# Patient Record
Sex: Female | Born: 1960 | Race: White | Hispanic: No | Smoking: Never smoker
Health system: Southern US, Community
[De-identification: ages and names within clinical notes are randomized; demographics above are authoritative.]

## PROBLEM LIST (undated history)

## (undated) DIAGNOSIS — D649 Anemia, unspecified: Secondary | ICD-10-CM

## (undated) DIAGNOSIS — N879 Dysplasia of cervix uteri, unspecified: Secondary | ICD-10-CM

## (undated) DIAGNOSIS — F4310 Post-traumatic stress disorder, unspecified: Secondary | ICD-10-CM

## (undated) DIAGNOSIS — F329 Major depressive disorder, single episode, unspecified: Secondary | ICD-10-CM

## (undated) DIAGNOSIS — F419 Anxiety disorder, unspecified: Secondary | ICD-10-CM

## (undated) DIAGNOSIS — T7840XA Allergy, unspecified, initial encounter: Secondary | ICD-10-CM

## (undated) DIAGNOSIS — F32A Depression, unspecified: Secondary | ICD-10-CM

## (undated) HISTORY — DX: Anemia, unspecified: D64.9

## (undated) HISTORY — DX: Allergy, unspecified, initial encounter: T78.40XA

## (undated) HISTORY — DX: Dysplasia of cervix uteri, unspecified: N87.9

## (undated) HISTORY — DX: Post-traumatic stress disorder, unspecified: F43.10

## (undated) HISTORY — PX: FACIAL COSMETIC SURGERY: SHX629

## (undated) HISTORY — DX: Major depressive disorder, single episode, unspecified: F32.9

## (undated) HISTORY — PX: RHINOPLASTY: SUR1284

## (undated) HISTORY — DX: Depression, unspecified: F32.A

## (undated) HISTORY — PX: OTHER SURGICAL HISTORY: SHX169

---

## 1988-12-13 DIAGNOSIS — N879 Dysplasia of cervix uteri, unspecified: Secondary | ICD-10-CM

## 1988-12-13 HISTORY — DX: Dysplasia of cervix uteri, unspecified: N87.9

## 1998-12-13 HISTORY — PX: BREAST SURGERY: SHX581

## 2015-11-10 ENCOUNTER — Other Ambulatory Visit: Payer: Self-pay

## 2015-11-10 ENCOUNTER — Ambulatory Visit: Payer: Self-pay | Admitting: Physician Assistant

## 2015-11-24 ENCOUNTER — Ambulatory Visit (INDEPENDENT_AMBULATORY_CARE_PROVIDER_SITE_OTHER): Payer: 59 | Admitting: Physician Assistant

## 2015-11-24 ENCOUNTER — Encounter: Payer: Self-pay | Admitting: Physician Assistant

## 2015-11-24 VITALS — BP 110/66 | HR 84 | Temp 97.7°F | Resp 16 | Ht 68.0 in | Wt 153.0 lb

## 2015-11-24 DIAGNOSIS — E559 Vitamin D deficiency, unspecified: Secondary | ICD-10-CM

## 2015-11-24 NOTE — Progress Notes (Signed)
Assessment and Plan:  Vitamin D def- check level, may need to increase supplement Depression, remission- controlled with diet/exercise.  Allergies- start on allergy pill Need colonoscopy Family history breast cancer- will look into breast specialist.   Had recent CPE in [place], will schedule CPE in future Over 30 minutes of exam, counseling, chart review, and critical decision making was performed  HPI 54 y.o. female  presents as a new patient.  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 Has been here a year.  Daughter is in [place], and Maurice March is with her right now.  Found out that her husband was having a double life, divorced since 2012, was on lexapro for some of that time but did not like it. Had some PTSD.  Has had allergies since Sept, was treated for sinus infection recently.  No complaints at this time other than some mild depression with weather, stress of moving.  Also mother had breast cancer at age of 86, patient has been seeing a breast specialist in [place], has never had abnormal MGM, has had abnormal PAP with cryo in 1990's.   Current Medications:  Current Outpatient Prescriptions on File Prior to Visit  Medication Sig Dispense Refill  . fluconazole (DIFLUCAN) 200 MG tablet     . naproxen (NAPROSYN) 250 MG tablet      No current facility-administered medications on file prior to visit.   Medical History:  Past Medical History  Diagnosis Date  . Cervical dysplasia 1990    cryotherapy, never abnormal pap since that time  . Anemia   . Depression   . Allergy    Review of Systems:  Review of Systems  Constitutional: Negative.   HENT: Positive for congestion. Negative for ear discharge, ear pain, hearing loss, nosebleeds, sore throat and tinnitus.   Eyes: Negative.   Respiratory: Negative for stridor.   Cardiovascular: Negative.   Gastrointestinal: Negative.   Genitourinary: Negative.   Musculoskeletal: Negative.   Skin: Negative.    Neurological: Negative.  Negative for headaches.  Endo/Heme/Allergies: Negative.   Psychiatric/Behavioral: Positive for depression. Negative for suicidal ideas, hallucinations, memory loss and substance abuse. The patient is not nervous/anxious and does not have insomnia.     Allergies Allergies  Allergen Reactions  . Flagyl [Metronidazole] Rash   Surgical history Past Surgical History  Procedure Laterality Date  . Breast surgery  2000    Breast augmentation   Family history Family History  Problem Relation Age of Onset  . Cancer Mother 24    breat  . Cancer Father 38    lung and liver    Physical Exam: BP 110/66 mmHg  Pulse 84  Temp(Src) 97.7 F (36.5 C) (Temporal)  Resp 16  Ht  (1.727 m)  Wt 153 lb (69.4 kg)  BMI 23.27 kg/m2  SpO2 98% Wt Readings from Last 3 Encounters:  11/24/15 153 lb (69.4 kg)   General Appearance: Well nourished, in no apparent distress. Eyes: PERRLA, EOMs, conjunctiva no swelling or erythema Sinuses: No Frontal/maxillary tenderness ENT/Mouth: Ext aud canals clear, TMs without erythema, bulging. No erythema, swelling, or exudate on post pharynx.  Tonsils not swollen or erythematous. Hearing normal.  Neck: Supple, thyroid normal.  Respiratory: Respiratory effort normal, BS equal bilaterally without rales, rhonchi, wheezing or stridor.  Cardio: RRR with no MRGs. Brisk peripheral pulses without edema.  Abdomen: Soft, + BS,  Non tender, no guarding, rebound, hernias, masses. Lymphatics: Non tender without lymphadenopathy.  Musculoskeletal: Full ROM, 5/5  strength, Normal gait Skin: Warm, dry without rashes, lesions, ecchymosis.  Neuro: Cranial nerves intact. Normal muscle tone, no cerebellar symptoms. Psych: Awake and oriented X 3, normal affect, Insight and Judgment appropriate.    Quentin MullingAmanda Maysa Lynn, PA-C 11:53 AM [facility]

## 2015-11-24 NOTE — Patient Instructions (Addendum)
[facility]   Vitamin D goal is between 60-80  Please make sure that you are taking your Vitamin D as directed.   It is very important as a natural anti-inflammatory   helping hair, skin, and nails, as well as reducing stroke and heart attack risk.   It helps your bones and helps with mood.  It also decreases numerous cancer risks so please take it as directed.   Low Vit D is associated with a 200-300% higher risk for CANCER   and 200-300% higher risk for HEART   ATTACK  &  STROKE.    .....................................Marland Kitchen.  It is also associated with higher death rate at younger ages,   autoimmune diseases like Rheumatoid arthritis, Lupus, Multiple Sclerosis.     Also many other serious conditions, like depression, Alzheimer's  Dementia, infertility, muscle aches, fatigue, fibromyalgia - just to name a few.  +++++++++++++++++++   Tumeric with black pepper extract is a great natural antiinflammatory that helps with arthritis and aches and pain. Can get from [employer] or any health food store. Need to take at least 800mg  twice a day with food.

## 2015-11-25 LAB — VITAMIN D 25 HYDROXY (VIT D DEFICIENCY, FRACTURES): VIT D 25 HYDROXY: 25 ng/mL — AB (ref 30–100)

## 2015-12-02 ENCOUNTER — Telehealth: Payer: Self-pay | Admitting: Internal Medicine

## 2015-12-02 NOTE — Telephone Encounter (Signed)
[facility]- Dr Hazle CocaSamuel Lentz, [phone], fax [phone]. Tried to call patient, voicemail full. Fax obgyn records to Dr Noland FordyceLentz for review before scheduling appointment.   Thank you, Theodoro KalataKatrina Welch Referral Coordinator  [facility] [phone]. [phone] Fax [phone]

## 2015-12-02 NOTE — Telephone Encounter (Signed)
-----   Message from Quentin MullingAmanda Collier, [place]PA-C sent at 11/24/2015 12:26 PM EST ----- No personal history of breast cancer, mom had breast cancer at 2853, wants to see breast specialist for OB[facility]GYN, [facility]?

## 2015-12-19 ENCOUNTER — Other Ambulatory Visit: Payer: Self-pay | Admitting: Physician Assistant

## 2015-12-23 ENCOUNTER — Other Ambulatory Visit: Payer: Self-pay | Admitting: Physician Assistant

## 2015-12-23 MED ORDER — VALACYCLOVIR HCL 500 MG PO TABS: 500.0000 mg | ORAL_TABLET | Freq: Every day | ORAL | Status: AC

## 2016-05-12 ENCOUNTER — Other Ambulatory Visit: Payer: Self-pay | Admitting: Internal Medicine

## 2016-05-12 ENCOUNTER — Ambulatory Visit (INDEPENDENT_AMBULATORY_CARE_PROVIDER_SITE_OTHER): Payer: BLUE CROSS/BLUE SHIELD | Admitting: Physician Assistant

## 2016-05-12 ENCOUNTER — Encounter: Payer: Self-pay | Admitting: Physician Assistant

## 2016-05-12 VITALS — BP 110/60 | HR 83 | Temp 97.3°F | Resp 16 | Ht 68.0 in | Wt 153.2 lb

## 2016-05-12 DIAGNOSIS — R5383 Other fatigue: Secondary | ICD-10-CM | POA: Diagnosis not present

## 2016-05-12 DIAGNOSIS — Z419 Encounter for procedure for purposes other than remedying health state, unspecified: Secondary | ICD-10-CM | POA: Diagnosis not present

## 2016-05-12 DIAGNOSIS — E559 Vitamin D deficiency, unspecified: Secondary | ICD-10-CM

## 2016-05-12 DIAGNOSIS — F329 Major depressive disorder, single episode, unspecified: Secondary | ICD-10-CM | POA: Insufficient documentation

## 2016-05-12 DIAGNOSIS — D649 Anemia, unspecified: Secondary | ICD-10-CM | POA: Insufficient documentation

## 2016-05-12 DIAGNOSIS — T7840XA Allergy, unspecified, initial encounter: Secondary | ICD-10-CM | POA: Insufficient documentation

## 2016-05-12 DIAGNOSIS — Z79899 Other long term (current) drug therapy: Secondary | ICD-10-CM

## 2016-05-12 DIAGNOSIS — T7840XD Allergy, unspecified, subsequent encounter: Secondary | ICD-10-CM

## 2016-05-12 DIAGNOSIS — F331 Major depressive disorder, recurrent, moderate: Secondary | ICD-10-CM | POA: Insufficient documentation

## 2016-05-12 DIAGNOSIS — F32A Depression, unspecified: Secondary | ICD-10-CM | POA: Insufficient documentation

## 2016-05-12 DIAGNOSIS — N879 Dysplasia of cervix uteri, unspecified: Secondary | ICD-10-CM | POA: Insufficient documentation

## 2016-05-12 LAB — APTT: APTT: 26 s (ref 24–37)

## 2016-05-12 LAB — PROTIME-INR
INR: 0.92 (ref <1.50)
Prothrombin Time: 12.5 seconds (ref 11.6–15.2)

## 2016-05-12 MED ORDER — TERBINAFINE HCL 250 MG PO TABS: 250.0000 mg | ORAL_TABLET | Freq: Every day | ORAL | Status: AC

## 2016-05-12 NOTE — Patient Instructions (Addendum)
DHEA supplements Zinc 50-160mcg  supplements And HIIT workouts  Okay I will send in lamisil for you to take. You can only get it from [facility] or [employer] will not cover it.The lamisil is taken once a day for  months and then if can be taken for several months after for a month on it and month off it. It gets processed through you liver so we need to check your liver function at 6 weeks after taking the drug. It can take up to 6 months to a year for your toenails to get better.    2.   High-Intensity Exercise like Peak Fitness  Short intense exercise has a proven positive effect on increasing testosterone levels and preventing its decline. That's unlike aerobics or prolonged moderate exercise, which have shown to have negative or no effect on testosterone levels. Having a whey protein meal after exercise can further enhance the satiety/testosterone-boosting impact (hunger hormones cause the opposite effect on your testosterone and libido). Here's a summary of what a typical high-intensity Peak Fitness routine might look like: " Warm up for three minutes  " Exercise as hard and fast as you can for 30 seconds. You should feel like you couldn't possibly go on another few seconds  " Recover at a slow to moderate pace for 90 seconds  " Repeat the high intensity exercise and recovery 7 more times .  3.   Consume Plenty of Zinc The mineral zinc is important for testosterone production, and supplementing your diet for as little as six weeks has been shown to cause a marked improvement in testosterone among men with low levels.1 Likewise, research has shown that restricting dietary sources of zinc leads to a significant decrease in testosterone, while zinc supplementation increases it2 -- and even protects men from exercised-induced reductions in testosterone levels.3 It's estimated that up to 45 percent of adults over the age of 60 may have lower than recommended zinc intakes; even  when dietary supplements were added in, an estimated 20-25 percent of older adults still had inadequate zinc intakes, according to a [employer] and Nutrition Examination Survey.4 Your diet is the best source of zinc; along with protein-rich foods like meats and fish, other good dietary sources of zinc include raw milk, raw cheese, beans, and yogurt or kefir made from raw milk. It can be difficult to obtain enough dietary zinc if you're a vegetarian, and also for meat-eaters as well, largely because of conventional farming methods that rely heavily on chemical fertilizers and pesticides. These chemicals deplete the soil of nutrients ... nutrients like zinc that must be absorbed by plants in order to be passed on to you. In many cases, you may further deplete the nutrients in your food by the way you prepare it. For most food, cooking it will drastically reduce its levels of nutrients like zinc . particularly over-cooking, which many people do. If you decide to use a zinc supplement, stick to a dosage of less than 40 mg a day, as this is the recommended adult upper limit. Taking too much zinc can interfere with your body's ability to absorb other minerals, especially copper, and may cause nausea as a side effect.  4.   Strength Training In addition to Peak Fitness, strength training is also known to boost testosterone levels, provided you are doing so intensely enough. When strength training to boost testosterone, you'll want to increase the weight and lower your number of reps, and then focus on exercises  that work a large number of muscles, such as dead lifts or squats.  You can "turbo-charge" your weight training by going slower. By slowing down your movement, you're actually turning it into a high-intensity exercise. Super Slow movement allows your muscle, at the microscopic level, to access the maximum number of cross-bridges between the protein filaments that produce movement in the muscle.   5.    Optimize Your Vitamin D Levels Vitamin D, a steroid hormone, is essential for the healthy development of the nucleus of the sperm cell, and helps maintain semen quality and sperm count. Vitamin D also increases levels of testosterone, which may boost libido. In one study, overweight men who were given vitamin D supplements had a significant increase in testosterone levels after one year.5   6.   Reduce Stress When you're under a lot of stress, your body releases high levels of the stress hormone cortisol. This hormone actually blocks the effects of testosterone,6 presumably because, from a biological standpoint, testosterone-associated behaviors (mating, competing, aggression) may have lowered your chances of survival in an emergency (hence, the "fight or flight" response is dominant, courtesy of cortisol).  7.    Eat Healthy Fats By healthy, this means not only mon- and polyunsaturated fats, like that found in avocadoes and nuts, but also saturated, as these are essential for building testosterone. Research shows that a diet with less than 40 percent of energy as fat (and that mainly from animal sources, i.e. saturated) lead to a decrease in testosterone levels.8 My personal diet is about 60-70 percent healthy fat, and other experts agree that the ideal diet includes somewhere between 50-70 percent fat.  It's important to understand that your body requires saturated fats from animal and vegetable sources (such as meat, dairy, certain oils, and tropical plants like coconut) for optimal functioning, and if you neglect this important food group in favor of sugar, grains and other starchy carbs, your health and weight are almost guaranteed to suffer. Examples of healthy fats you can eat more of to give your testosterone levels a boost include: Olives and Olive oil  Coconuts and coconut oil Butter made from raw grass-fed organic milk Raw nuts, such as, almonds or pecans Organic pastured egg  yolks Avocados Grass-fed meats Palm oil Unheated organic nut oils   9.   Boost Your Intake of Branch Chain Amino Acids (BCAA) from Foods Like Whey Protein Research suggests that BCAAs result in higher testosterone levels, particularly when taken along with resistance training.9 While BCAAs are available in supplement form, you'll find the highest concentrations of BCAAs like leucine in dairy products - especially quality cheeses and whey protein. Even when getting leucine from your natural food supply, it's often wasted or used as a building block instead of an anabolic agent. So to create the correct anabolic environment, you need to boost leucine consumption way beyond mere maintenance levels. That said, keep in mind that using leucine as a free form amino acid can be highly counterproductive as when free form amino acids are artificially administrated, they rapidly enter your circulation while disrupting insulin function, and impairing your body's glycemic control. Food-based leucine is really the ideal form that can benefit your muscles without side effects.    Vitamin D goal is between 60-80  Please make sure that you are taking your Vitamin D as directed.   It is very important as a natural anti-inflammatory   helping hair, skin, and nails, as well as reducing stroke and heart attack risk.  It helps your bones and helps with mood.  It also decreases numerous cancer risks so please take it as directed.   Low Vit D is associated with a 200-300% higher risk for CANCER   and 200-300% higher risk for HEART   ATTACK  &  STROKE.    .....................................Marland Kitchen  It is also associated with higher death rate at younger ages,   autoimmune diseases like Rheumatoid arthritis, Lupus, Multiple Sclerosis.     Also many other serious conditions, like depression, Alzheimer's  Dementia, infertility, muscle aches, fatigue, fibromyalgia - just to name a few.  +++++++++++++++++++  Can  get liquid vitamin D from [place] OR here in [place] at  [facility] alternatives

## 2016-05-12 NOTE — Progress Notes (Signed)
   Subjective:    Patient ID: Shelby LoftBabette Ramirez, female    DOB: 22-Dec-1960, 55 y.o.   MRN: [id number]  HPI 55 y.o. F presents for OV. Was seen as new patient in Dec 2016, she does not have any major health issues. She is having a tummy tuck with Dr. Charisse KlinefelterAlex Campbell in [place], needs CBC, PT/PTT and EKG.  She states that for the past year and half she has had some left toenail issues, treated last summer but states it has come back.  She states she has bilateral torn meniscal, was a runner, she is lifting weights more and running less but she  BMI is Body mass index is 23.3 kg/(m^2)., she is working on diet and exercise. Wt Readings from Last 3 Encounters:  05/12/16 153 lb 3.2 oz (69.491 kg)  11/24/15 153 lb (69.4 kg)    Height 5\' 8"  (1.727 m), weight 153 lb 3.2 oz (69.491 kg). Past Medical History  Diagnosis Date  . Cervical dysplasia 1990    cryotherapy, never abnormal pap since that time  . Anemia   . Depression   . Allergy    Current Outpatient Prescriptions on File Prior to Visit  Medication Sig Dispense Refill  . valACYclovir (VALTREX) 500 MG tablet Take 1 tablet (500 mg total) by mouth daily. 30 tablet 5   No current facility-administered medications on file prior to visit.   Review of Systems  Constitutional: Positive for fatigue. Negative for fever, chills, diaphoresis, activity change and appetite change.  HENT: Negative.   Respiratory: Negative.   Cardiovascular: Negative.   Gastrointestinal: Negative.   Genitourinary: Negative.   Musculoskeletal: Negative.   Skin: Negative.   Neurological: Negative.   Psychiatric/Behavioral: Negative.        Objective:   Physical Exam  Constitutional: She is oriented to person, place, and time. She appears well-developed and well-nourished.  HENT:  Head: Normocephalic and atraumatic.  Right Ear: External ear normal.  Left Ear: External ear normal.  Mouth/Throat: Oropharynx is clear and moist.  Eyes: Conjunctivae and EOM are  normal. Pupils are equal, round, and reactive to light.  Neck: Normal range of motion. Neck supple. No thyromegaly present.  Cardiovascular: Normal rate, regular rhythm and normal heart sounds.  Exam reveals no gallop and no friction rub.   No murmur heard. Pulmonary/Chest: Effort normal and breath sounds normal. No respiratory distress. She has no wheezes.  Abdominal: Soft. Bowel sounds are normal. She exhibits no distension and no mass. There is no tenderness. There is no rebound and no guarding.  Musculoskeletal: Normal range of motion.  Lymphadenopathy:    She has no cervical adenopathy.  Neurological: She is alert and oriented to person, place, and time. She displays normal reflexes. No cranial nerve deficit. Coordination normal.  Skin: Skin is warm and dry.  Psychiatric: She has a normal mood and affect.      Assessment & Plan:  1. Medication management - CBC with Differential/Platelet - BASIC METABOLIC PANEL WITH GFR - Hepatic function panel  2. Decreased muscle mass/fatigue with menopause - add DHEA, ways to increase testosterone given, check labs - EKG 12-Lead - ACTH - Testosterone - FSH - Estrogens, Total  3. Optional surgery Will send labs and EKG to Dr. Bonnita Nasutiambell,  - EKG 12-Lead - Protime-INR - APTT  4. Onychomycosis - has tried OTC meds, willing to try Lamisil, side effects discussed, will follow up in 6 weeks for LFTs, cheapest at [employer]  5. Vitamin D def Check levels

## 2016-05-13 LAB — BASIC METABOLIC PANEL WITH GFR
BUN: 30 mg/dL — ABNORMAL HIGH (ref 7–25)
CHLORIDE: 105 mmol/L (ref 98–110)
CO2: 28 mmol/L (ref 20–31)
Calcium: 9.5 mg/dL (ref 8.6–10.4)
Creat: 0.7 mg/dL (ref 0.50–1.05)
GLUCOSE: 69 mg/dL (ref 65–99)
POTASSIUM: 4.1 mmol/L (ref 3.5–5.3)
SODIUM: 140 mmol/L (ref 135–146)

## 2016-05-13 LAB — CBC WITH DIFFERENTIAL/PLATELET
BASOS ABS: 0 {cells}/uL (ref 0–200)
Basophils Relative: 0 %
EOS ABS: 252 {cells}/uL (ref 15–500)
EOS PCT: 4 %
HCT: 40 % (ref 35.0–45.0)
HEMOGLOBIN: 13.1 g/dL (ref 11.7–15.5)
LYMPHS ABS: 2142 {cells}/uL (ref 850–3900)
Lymphocytes Relative: 34 %
MCH: 31.1 pg (ref 27.0–33.0)
MCHC: 32.8 g/dL (ref 32.0–36.0)
MCV: 95 fL (ref 80.0–100.0)
MPV: 11.4 fL (ref 7.5–12.5)
Monocytes Absolute: 441 cells/uL (ref 200–950)
Monocytes Relative: 7 %
NEUTROS ABS: 3465 {cells}/uL (ref 1500–7800)
Neutrophils Relative %: 55 %
Platelets: 222 10*3/uL (ref 140–400)
RBC: 4.21 MIL/uL (ref 3.80–5.10)
RDW: 13.5 % (ref 11.0–15.0)
WBC: 6.3 10*3/uL (ref 3.8–10.8)

## 2016-05-13 LAB — HEPATIC FUNCTION PANEL
ALK PHOS: 86 U/L (ref 33–130)
ALT: 27 U/L (ref 6–29)
AST: 26 U/L (ref 10–35)
Albumin: 4.1 g/dL (ref 3.6–5.1)
Bilirubin, Direct: <0.1 mg/dL (ref <=0.2)
TOTAL PROTEIN: 6.2 g/dL (ref 6.1–8.1)
Total Bilirubin: 0.2 mg/dL (ref 0.2–1.2)

## 2016-05-13 LAB — VITAMIN D 25 HYDROXY (VIT D DEFICIENCY, FRACTURES): Vit D, 25-Hydroxy: 50 ng/mL (ref 30–100)

## 2016-05-13 LAB — FOLLICLE STIMULATING HORMONE: FSH: 79.8 m[IU]/mL

## 2016-05-13 LAB — TESTOSTERONE: TESTOSTERONE: 38 ng/dL

## 2016-05-15 LAB — ESTROGENS, TOTAL: Estrogen: 51.1 pg/mL

## 2016-05-17 LAB — ACTH: C206 ACTH: 11 pg/mL (ref 6–50)

## 2016-06-23 ENCOUNTER — Encounter: Payer: Self-pay | Admitting: Physician Assistant

## 2016-07-15 ENCOUNTER — Encounter: Payer: Self-pay | Admitting: Physician Assistant

## 2016-07-15 ENCOUNTER — Ambulatory Visit (INDEPENDENT_AMBULATORY_CARE_PROVIDER_SITE_OTHER): Payer: BLUE CROSS/BLUE SHIELD | Admitting: Physician Assistant

## 2016-07-15 VITALS — BP 120/64 | HR 81 | Temp 97.9°F | Resp 16 | Ht 68.0 in | Wt 138.0 lb

## 2016-07-15 DIAGNOSIS — Z4802 Encounter for removal of sutures: Secondary | ICD-10-CM | POA: Diagnosis not present

## 2016-07-15 NOTE — Progress Notes (Signed)
   Subjective:    Patient ID: Shelby Ramirez, female    DOB: 08/31/1961, 55 y.o.   MRN: [id number]  HPI 55 y.o. WF presents after plastic surgery in [place] in June, had rhinoplasty, face lift, and tummy tuck. States she is doing well, normal swelling that is minimal on her face. She has no redness, discharge, fever, chills. Has suture left in left nostril and would like it removed, has been in 6 months.   Blood pressure 120/64, pulse 81, temperature 97.9 F (36.6 C), temperature source Temporal, resp. rate 16, height 5\' 8"  (1.727 m), weight 138 lb (62.6 kg), SpO2 98 %.  Medications Current Outpatient Prescriptions on File Prior to Visit  Medication Sig  . terbinafine (LAMISIL) 250 MG tablet Take 1 tablet (250 mg total) by mouth daily. Take one daily for 3 months, need liver function lab at 6 weeks. (Patient not taking: Reported on 07/15/2016)  . valACYclovir (VALTREX) 500 MG tablet Take 1 tablet (500 mg total) by mouth daily.   No current facility-administered medications on file prior to visit.     Problem list She has Cervical dysplasia; Anemia; Depression; and Allergy on her problem list.  Review of Systems  All other systems reviewed and are negative.      Objective:   Physical Exam  Constitutional: She is oriented to person, place, and time. She appears well-developed and well-nourished.  HENT:  Head: Normocephalic and atraumatic.  Right Ear: External ear normal.  Left Ear: External ear normal.  Nose: Foreign body (2 sutures removed from inner nostril, some mild erythema, tolerated well) is present.  Mouth/Throat: Oropharynx is clear and moist.  Well healing vertical scars bilateral posterior ears, healing well.   Eyes: Conjunctivae and EOM are normal. Pupils are equal, round, and reactive to light.  Neck: Normal range of motion. Neck supple. No thyromegaly present.  Cardiovascular: Normal rate, regular rhythm and normal heart sounds.  Exam reveals no gallop and no friction  rub.   No murmur heard. Pulmonary/Chest: Effort normal and breath sounds normal. No respiratory distress. She has no wheezes.  Abdominal: Soft. Bowel sounds are normal. She exhibits no distension and no mass. There is tenderness (well healing horizonital scar superior to pubis, no warmth, swelling. ). There is no rebound and no guarding.  Musculoskeletal: Normal range of motion.  Lymphadenopathy:    She has no cervical adenopathy.  Neurological: She is alert and oriented to person, place, and time. She displays normal reflexes. No cranial nerve deficit. Coordination normal.  Skin: Skin is warm and dry.  Psychiatric: She has a normal mood and affect.      Assessment & Plan:  Suture removal Tolerated well, 2 sutures removed from left nasal septum.

## 2016-07-15 NOTE — Patient Instructions (Addendum)
Do the neosporin for 2-3 days on the nostril.   Call if you have any swelling, redness, or discharge.   Can continue wash it daily

## 2016-07-16 ENCOUNTER — Encounter: Payer: Self-pay | Admitting: Physician Assistant

## 2016-07-16 NOTE — Progress Notes (Deleted)
Complete Physical  Assessment and Plan:  Discussed med's effects and SE's. Screening labs and tests as requested with regular follow-up as recommended. Over 40 minutes of exam, counseling, chart review, and complex, high level critical decision making was performed this visit.   HPI  55 y.o. female  presents for a complete physical and follow up for has Cervical dysplasia; Anemia; Depression; and Allergy on her problem list..  Her blood pressure has been controlled at home, today their BP is   She does workout. She denies chest pain, shortness of breath, dizziness.   Patient is on Vitamin D supplement.   Lab Results  Component Value Date   VD25OH 50 05/12/2016      Current Medications:  Current Outpatient Prescriptions on File Prior to Visit  Medication Sig Dispense Refill  . terbinafine (LAMISIL) 250 MG tablet Take 1 tablet (250 mg total) by mouth daily. Take one daily for 3 months, need liver function lab at 6 weeks. (Patient not taking: Reported on 07/15/2016) 90 tablet 0  . valACYclovir (VALTREX) 500 MG tablet Take 1 tablet (500 mg total) by mouth daily. 30 tablet 5   No current facility-administered medications on file prior to visit.    Allergies:  Allergies  Allergen Reactions  . Flagyl [Metronidazole] Rash   Medical History:  She has Cervical dysplasia; Anemia; Depression; and Allergy on her problem list.   Health Maintenance:   Tetanus: Pneumovax: Prevnar 13:  Flu vaccine: Zostavax: LMP: Pap: MGM:  DEXA: Colonoscopy: EGD:  Last Dental Exam: Last Eye Exam: Patient Care Team: Lucky Cowboy, MD as PCP - General (Internal Medicine)  Surgical History:  She has a past surgical history that includes Breast surgery (2000). Family History:  Herfamily history includes Cancer (age of onset: 22) in her mother; Cancer (age of onset: 77) in her father. Social History:  She reports that she has never smoked. She uses smokeless tobacco. She reports that she does not  drink alcohol or use drugs.  Review of Systems: ROS  Physical Exam: Estimated body mass index is 20.98 kg/m as calculated from the following:   Height as of 07/15/16: 5\' 8"  (1.727 m).   Weight as of 07/15/16: 138 lb (62.6 kg). There were no vitals taken for this visit. General Appearance: Well nourished, in no apparent distress.  Eyes: PERRLA, EOMs, conjunctiva no swelling or erythema, normal fundi and vessels.  Sinuses: No Frontal/maxillary tenderness  ENT/Mouth: Ext aud canals clear, normal light reflex with TMs without erythema, bulging. Good dentition. No erythema, swelling, or exudate on post pharynx. Tonsils not swollen or erythematous. Hearing normal.  Neck: Supple, thyroid normal. No bruits  Respiratory: Respiratory effort normal, BS equal bilaterally without rales, rhonchi, wheezing or stridor.  Cardio: RRR without murmurs, rubs or gallops. Brisk peripheral pulses without edema.  Chest: symmetric, with normal excursions and percussion.  Breasts: Symmetric, without lumps, nipple discharge, retractions.  Abdomen: Soft, nontender, no guarding, rebound, hernias, masses, or organomegaly.  Lymphatics: Non tender without lymphadenopathy.  Genitourinary:  Musculoskeletal: Full ROM all peripheral extremities,5/5 strength, and normal gait.  Skin: Warm, dry without rashes, lesions, ecchymosis. Neuro: Cranial nerves intact, reflexes equal bilaterally. Normal muscle tone, no cerebellar symptoms. Sensation intact.  Psych: Awake and oriented X 3, normal affect, Insight and Judgment appropriate.   EKG: WNL no ST changes. AORTA SCAN: defer   Quentin Mulling 8:58 AM [facility]

## 2016-08-06 ENCOUNTER — Encounter: Payer: Self-pay | Admitting: Physician Assistant

## 2016-08-30 ENCOUNTER — Encounter: Payer: Self-pay | Admitting: Physician Assistant

## 2016-08-30 ENCOUNTER — Ambulatory Visit (INDEPENDENT_AMBULATORY_CARE_PROVIDER_SITE_OTHER): Payer: BLUE CROSS/BLUE SHIELD | Admitting: Physician Assistant

## 2016-08-30 VITALS — BP 122/84 | HR 79 | Temp 97.9°F | Resp 16 | Ht 68.0 in | Wt 144.0 lb

## 2016-08-30 DIAGNOSIS — R5383 Other fatigue: Secondary | ICD-10-CM

## 2016-08-30 DIAGNOSIS — F329 Major depressive disorder, single episode, unspecified: Secondary | ICD-10-CM

## 2016-08-30 DIAGNOSIS — Z79899 Other long term (current) drug therapy: Secondary | ICD-10-CM

## 2016-08-30 DIAGNOSIS — E559 Vitamin D deficiency, unspecified: Secondary | ICD-10-CM

## 2016-08-30 DIAGNOSIS — Z1212 Encounter for screening for malignant neoplasm of rectum: Secondary | ICD-10-CM

## 2016-08-30 DIAGNOSIS — Z13220 Encounter for screening for lipoid disorders: Secondary | ICD-10-CM

## 2016-08-30 DIAGNOSIS — F32A Depression, unspecified: Secondary | ICD-10-CM

## 2016-08-30 DIAGNOSIS — D649 Anemia, unspecified: Secondary | ICD-10-CM | POA: Diagnosis not present

## 2016-08-30 DIAGNOSIS — Z0000 Encounter for general adult medical examination without abnormal findings: Secondary | ICD-10-CM

## 2016-08-30 DIAGNOSIS — Z0001 Encounter for general adult medical examination with abnormal findings: Secondary | ICD-10-CM

## 2016-08-30 DIAGNOSIS — R6889 Other general symptoms and signs: Secondary | ICD-10-CM

## 2016-08-30 DIAGNOSIS — Z1389 Encounter for screening for other disorder: Secondary | ICD-10-CM

## 2016-08-30 LAB — CBC WITH DIFFERENTIAL/PLATELET
BASOS PCT: 0 %
Basophils Absolute: 0 cells/uL (ref 0–200)
EOS PCT: 3 %
Eosinophils Absolute: 225 cells/uL (ref 15–500)
HCT: 41.9 % (ref 35.0–45.0)
HEMOGLOBIN: 13.9 g/dL (ref 11.7–15.5)
LYMPHS ABS: 1725 {cells}/uL (ref 850–3900)
Lymphocytes Relative: 23 %
MCH: 31.8 pg (ref 27.0–33.0)
MCHC: 33.2 g/dL (ref 32.0–36.0)
MCV: 95.9 fL (ref 80.0–100.0)
MONOS PCT: 12 %
MPV: 11.6 fL (ref 7.5–12.5)
Monocytes Absolute: 900 cells/uL (ref 200–950)
NEUTROS ABS: 4650 {cells}/uL (ref 1500–7800)
Neutrophils Relative %: 62 %
Platelets: 244 10*3/uL (ref 140–400)
RBC: 4.37 MIL/uL (ref 3.80–5.10)
RDW: 13.3 % (ref 11.0–15.0)
WBC: 7.5 10*3/uL (ref 3.8–10.8)

## 2016-08-30 LAB — BASIC METABOLIC PANEL WITH GFR
BUN: 32 mg/dL — ABNORMAL HIGH (ref 7–25)
CHLORIDE: 103 mmol/L (ref 98–110)
CO2: 30 mmol/L (ref 20–31)
CREATININE: 0.57 mg/dL (ref 0.50–1.05)
Calcium: 9.7 mg/dL (ref 8.6–10.4)
GFR, Est African American: >89 mL/min (ref >=60)
Glucose, Bld: 70 mg/dL (ref 65–99)
Potassium: 4 mmol/L (ref 3.5–5.3)
SODIUM: 143 mmol/L (ref 135–146)

## 2016-08-30 LAB — LIPID PANEL
CHOLESTEROL: 209 mg/dL — AB (ref 125–200)
HDL: 89 mg/dL (ref >=46)
LDL Cholesterol: 106 mg/dL (ref <130)
TRIGLYCERIDES: 70 mg/dL (ref <150)
Total CHOL/HDL Ratio: 2.3 Ratio (ref <=5.0)
VLDL: 14 mg/dL (ref <30)

## 2016-08-30 LAB — HEPATIC FUNCTION PANEL
ALT: 32 U/L — ABNORMAL HIGH (ref 6–29)
AST: 36 U/L — AB (ref 10–35)
Albumin: 4.5 g/dL (ref 3.6–5.1)
Alkaline Phosphatase: 111 U/L (ref 33–130)
BILIRUBIN DIRECT: 0.1 mg/dL (ref <=0.2)
BILIRUBIN INDIRECT: 0.3 mg/dL (ref 0.2–1.2)
BILIRUBIN TOTAL: 0.4 mg/dL (ref 0.2–1.2)
Total Protein: 6.9 g/dL (ref 6.1–8.1)

## 2016-08-30 LAB — MAGNESIUM: Magnesium: 2.4 mg/dL (ref 1.5–2.5)

## 2016-08-30 LAB — TSH: TSH: 2.38 m[IU]/L

## 2016-08-30 NOTE — Progress Notes (Signed)
Complete Physical  Assessment and Plan: 1. Vitamin D deficiency - VITAMIN D 25 Hydroxy (Vit-D Deficiency, Fractures)  2. Anemia, unspecified anemia type - CBC with Differential/Platelet  3. Depression Wants hormonal therapy for it and declines medication at this time.   stress management techniques discussed, increase water, good sleep hygiene discussed, increase exercise, and increase veggies.   4. Medication management - CBC with Differential/Platelet - BASIC METABOLIC PANEL WITH GFR - Hepatic function panel - Magnesium  5. Other fatigue - TSH - Testosterone - patient wants testosterone prescribed for energy and muscle mass, told her to follow up with GYN.   6. Screening for rectal cancer - Ambulatory referral to Gastroenterology  7. Screening cholesterol level - Lipid panel  8. Screening for blood or protein in urine - Urinalysis, Routine w reflex microscopic (not at [facility]) - Microalbumin / creatinine urine ratio  9. Routine general medical examination at a health care facility - CBC with Differential/Platelet - BASIC METABOLIC PANEL WITH GFR - Hepatic function panel - TSH - Lipid panel - Magnesium - VITAMIN D 25 Hydroxy (Vit-D Deficiency, Fractures) - Urinalysis, Routine w reflex microscopic ([facility]) - Microalbumin / creatinine urine ratio  Discussed med's effects and SE's. Screening labs and tests as requested with regular follow-up as recommended. Over 40 minutes of exam, counseling, chart review, and complex, high level critical decision making was performed this visit.   HPI  55 y.o. female  presents for a complete physical and follow up for has Cervical dysplasia; Anemia; Depression; and Allergy on her problem list..  Her blood pressure has been controlled at home, today their BP is BP: 122/84 She does workout. She denies chest pain, shortness of breath, dizziness.  She is a Runner, broadcasting/film/videoteacher at [employer], adjunct professor, divorced 2 years ago, found  husband leading double life, had some anxiety from this but has done counseling .  She is post menopausal x 1 year, last FSH was 79.8, we tested hormones last OV and her testosterone was low normal. She started on DHEA .  Patient is on Vitamin D supplement.   Lab Results  Component Value Date   VD25OH 50 05/12/2016      Current Medications:  Current Outpatient Prescriptions on File Prior to Visit  Medication Sig Dispense Refill  . valACYclovir (VALTREX) 500 MG tablet Take 1 tablet (500 mg total) by mouth daily. 30 tablet 5   No current facility-administered medications on file prior to visit.    Allergies:  Allergies  Allergen Reactions  . Flagyl [Metronidazole] Rash   Medical History:  She has Cervical dysplasia; Anemia; Depression; and Allergy on her problem list.   Health Maintenance:   Tetanus:  2015 Pneumovax: Prevnar 13:  Flu vaccine: not yet Zostavax: LMP: Post menopausal Pap: July 2016, had abnormal in 1989 MGM:July 2016   DEXA: N/A Colonoscopy: 1996 EGD:  Patient Care Team: Lucky CowboyWilliam McKeown, MD as PCP - General (Internal Medicine)  Surgical History:  She has a past surgical history that includes Breast surgery (2000); Rhinoplasty; Facial cosmetic surgery; and tummy tuck. Family History:  Herfamily history includes Cancer (age of onset: 3353) in her mother; Cancer (age of onset: 281) in her father. Social History:  She reports that she has never smoked. She uses smokeless tobacco. She reports that she does not drink alcohol or use drugs.  Review of Systems: ROS  Physical Exam: Estimated body mass index is 21.9 kg/m as calculated from the following:   Height as of this encounter:  5\' 8"  (1.727 m).   Weight as of this encounter: 144 lb (65.3 kg). BP 122/84   Pulse 79   Temp 97.9 F (36.6 C)   Resp 16   Ht 5\' 8"  (1.727 m)   Wt 144 lb (65.3 kg)   SpO2 98%   BMI 21.90 kg/m  General Appearance: Well nourished, in no apparent distress.  Eyes: PERRLA, EOMs,  conjunctiva no swelling or erythema, normal fundi and vessels.  Sinuses: No Frontal/maxillary tenderness  ENT/Mouth: Ext aud canals clear, normal light reflex with TMs without erythema, bulging. Good dentition. No erythema, swelling, or exudate on post pharynx. Tonsils not swollen or erythematous. Hearing normal.  Neck: Supple, thyroid normal. No bruits  Respiratory: Respiratory effort normal, BS equal bilaterally without rales, rhonchi, wheezing or stridor.  Cardio: RRR without murmurs, rubs or gallops. Brisk peripheral pulses without edema.  Chest: symmetric, with normal excursions and percussion.  Breasts: Symmetric, without lumps, nipple discharge, retractions.  Abdomen: Soft, nontender, no guarding, rebound, hernias, masses, or organomegaly.  Lymphatics: Non tender without lymphadenopathy.  Genitourinary:  Musculoskeletal: Full ROM all peripheral extremities,5/5 strength, and normal gait.  Skin: Warm, dry without rashes, lesions, ecchymosis. Neuro: Cranial nerves intact, reflexes equal bilaterally. Normal muscle tone, no cerebellar symptoms. Sensation intact.  Psych: Awake and oriented X 3, normal affect, Insight and Judgment appropriate.   EKG: WNL no ST changes. AORTA SCAN: WNL   Quentin Mulling 9:45 AM [facility]

## 2016-08-30 NOTE — Patient Instructions (Addendum)
[facility]  7 a.m.-6:30 p.m., Monday 7 a.m.-5 p.m., Tuesday-Friday Schedule an appointment by calling [phone].  Encourage you to get the 3D Mammogram  The 3D Mammogram is much more specific and sensitive to pick up breast cancer. For women with fibrocystic breast or lumpy breast it can be hard to determine if it is cancer or not but the 3D mammogram is able to tell this difference which cuts back on unneeded additional tests or scary call backs.   - over 40% increase in detection of breast cancer - over 40% reduction in false positives.  - fewer call backs - reduced anxiety - improved outcomes - [facility]  We will set you up for a colonoscopy We will refer you to a GYN for hormonal therapy  Dr. Henderson CloudHorvath [phone] Dr. Senaida Oresichardson [phone] [facility]- can check them out too

## 2016-08-31 LAB — MICROALBUMIN / CREATININE URINE RATIO
CREATININE, URINE: 52 mg/dL (ref 20–320)
MICROALB UR: 0.3 mg/dL
MICROALB/CREAT RATIO: 6 ug/mg{creat} (ref <30)

## 2016-08-31 LAB — URINALYSIS, MICROSCOPIC ONLY
BACTERIA UA: NONE SEEN [HPF]
CASTS: NONE SEEN [LPF]
CRYSTALS: NONE SEEN [HPF]
RBC / HPF: NONE SEEN RBC/HPF (ref <=2)
SQUAMOUS EPITHELIAL / LPF: NONE SEEN [HPF] (ref <=5)
WBC, UA: NONE SEEN WBC/HPF (ref <=5)
YEAST: NONE SEEN [HPF]

## 2016-08-31 LAB — URINALYSIS, ROUTINE W REFLEX MICROSCOPIC
BILIRUBIN URINE: NEGATIVE
Glucose, UA: NEGATIVE
Hgb urine dipstick: NEGATIVE
Ketones, ur: NEGATIVE
NITRITE: NEGATIVE
PROTEIN: NEGATIVE
SPECIFIC GRAVITY, URINE: 1.017 (ref 1.001–1.035)
pH: 6 (ref 5.0–8.0)

## 2016-08-31 LAB — VITAMIN D 25 HYDROXY (VIT D DEFICIENCY, FRACTURES): VIT D 25 HYDROXY: 80 ng/mL (ref 30–100)

## 2016-08-31 LAB — TESTOSTERONE: Testosterone: 51 ng/dL

## 2016-09-29 ENCOUNTER — Encounter: Payer: Self-pay | Admitting: Internal Medicine

## 2017-03-24 ENCOUNTER — Encounter: Payer: Self-pay | Admitting: Internal Medicine

## 2017-03-24 ENCOUNTER — Ambulatory Visit (INDEPENDENT_AMBULATORY_CARE_PROVIDER_SITE_OTHER): Payer: BLUE CROSS/BLUE SHIELD | Admitting: Internal Medicine

## 2017-03-24 VITALS — BP 126/64 | HR 84 | Temp 98.2°F | Resp 16 | Ht 68.0 in

## 2017-03-24 DIAGNOSIS — M7651 Patellar tendinitis, right knee: Secondary | ICD-10-CM

## 2017-03-24 MED ORDER — MELOXICAM 15 MG PO TABS: 15.0000 mg | ORAL_TABLET | Freq: Every day | ORAL | 0 refills | Status: DC

## 2017-03-24 NOTE — Patient Instructions (Signed)
Try using either 1/2 inch tape or tear a full athletic tape roll in half and wrap in the space right underneath the knee cap.  You can tape the sticky ends of the tape to itself if you have a hard time keeping it on when you sweat.    You will want to work on eccentric muscle training.  You can get on the leg extension machine and work on very slowly getting to flexion.  Do sets of 16-20 and use lighter weight.    Take the meloxicam once daily on a full stomach.  You cannot take ibuprofen advil aleve naproxen with it.  You will need to drink plenty of water with it to protect your kidneys.  It is okay to take tylenol with it.     Patellar Tendinitis Patellar tendinitis, also called jumper's knee, is inflammation of the patellar tendon. Tendons are cord-like tissues that connect muscles to bones. The patellar tendon connects the bottom of the kneecap (patella) to the top of the shin bone (tibia). Patellar tendinitis causes pain in the front of the knee. The condition happens in the following stages:  Stage 1. In this stage, there is pain only after activity.  Stage 2: In this stage, you have pain during and after activity.  Stage 3: In this stage, you have pain during and after activity that limits your ability to do the activity.  Stage 4: In this stage, the tendon tears and severely limits your activity. What are the causes? This condition is caused by repeated (repetitive) stress on the tendon. This stress may cause the tendon to stretch, swell, thicken, or tear. What increases the risk? The following factors make you more likely to develop this condition:  Participating in sports that involve running, kicking and jumping, especially on hard surfaces. These include:  Basketball.  Volleyball.  Soccer.  Track and field.  Having tight thigh muscles.  Having received steroid injections in the tendon.  Having had knee surgery.  Being 79-84 years old.  Having rheumatoid arthritis  or diabetes.  Training too hard. What are the signs or symptoms? The main symptom of this condition is pain in the front of the knee. The pain usually starts slowly then it gradually gets worse. It may become painful to straighten your leg. How is this diagnosed? This condition may be diagnosed based on:  Your symptoms.  A medical history.  A physical exam. During the physical exam, your health care provider may check for tenderness in your patella, tightness in your thigh muscles, and pain when you straighten your knee.  Imaging tests, including:  X-rays. These will show the position and condition of your patella.  MRI. This will show any tears in your tendon.  Ultrasound. This will show any swelling in your tendon and the thickness of your tendon. How is this treated? Treatment for this condition depends on the stage of the condition. It may involve:  Avoiding activities that cause pain.  Icing your knee.  Taking an NSAID to reduce pain and swelling.  Doing stretching and strengthening exercises (physical therapy) when pain and swelling improve.  Having sound wave stimulation to promote healing.  Wearing a knee brace. This may be needed if your condition does not improve with treatment.  Using crutches or a walker. This may be needed if your condition does not improve with treatment.  Surgery. This may be done if you have stage 4 tendinitis. Follow these instructions at home: If You Have a  Knee Brace:   Wear it as told by your health care provider. Remove it only as told by your health care provider.  Loosen the brace if your toes become numb and tingle, or if they turn cold and blue.  Do not let your brace get wet if it is not waterproof.  Keep the brace clean.  Ask your health care provider when it is safe for you to drive. Managing pain, stiffness, and swelling   Take over-the-counter and prescription medicines only as told by your health care provider.  If  directed, apply ice to the injured area.  Put ice in a plastic bag.  Place a towel between your skin and the bag.  Leave the ice on for 20 minutes, 2-3 times a day.  Move your toes often to avoid stiffness and to lessen swelling.  Raise (elevate) your knee above the level of your heart while you are sitting or lying down. Activity   Return to your normal activities as told by your health care provider. Ask your health care provider what activities are safe for you.  Do exercises as told by your health care provider. General instructions   Do not use the injured limb to support your body weight until your health care provider says that you can. Use your crutches or walker as told by your health care provider.  Keep all follow-up visits as told by your health care provider. This is important. How is this prevented?  Warm up and stretch before being active.  Cool down and stretch after being active.  Give your body time to rest between periods of activity.  Make sure to use equipment that fits you.  Be safe and responsible while being active to avoid falls.  Do at least 150 minutes of moderate-intensity exercise each week, such as brisk walking or water aerobics.  Maintain physical fitness, including:  Strength.  Flexibility.  Cardiovascular fitness.  Endurance. Contact a health care provider if:  Your symptoms have not improve in 6 weeks.  Your symptoms get worse. This information is not intended to replace advice given to you by your health care provider. Make sure you discuss any questions you have with your health care provider. Document Released: 11/29/2005 Document Revised: 08/03/2016 Document Reviewed: 09/02/2015 Elsevier Interactive Patient Education  2017 [employer].

## 2017-03-24 NOTE — Progress Notes (Signed)
Assessment and Plan:   1. Patellar tendinitis of right knee -knee strap with exercise -daily antiinflammatory x 1 month -light exercise for lower body     HPI 56 y.o.female presents for evaluation of knee pain which started after a fall.  She reports that she was stepping out of a leg press machine and landed on the right knee. She reports that she has been having pain since the fall.  She reports that she wears a knee brace.  She has been using ibuprofen, ice, and also used tiger balm.  She has been able to continue to work out.  She reports that the pain got worse yesterday with transitioning to sitting and standing. She reports that the resistance is what makes it hurt worse.    Past Medical History:  Diagnosis Date  . Allergy   . Anemia   . Cervical dysplasia 1990   cryotherapy, never abnormal pap since that time  . Depression      Allergies  Allergen Reactions  . Flagyl [Metronidazole] Rash      Current Outpatient Prescriptions on File Prior to Visit  Medication Sig Dispense Refill  . valACYclovir (VALTREX) 500 MG tablet Take 1 tablet (500 mg total) by mouth daily. 30 tablet 5   No current facility-administered medications on file prior to visit.     ROS: all negative except above.   Physical Exam: There were no vitals filed for this visit. BP 126/64   Pulse 84   Temp 98.2 F (36.8 C) (Temporal)   Resp 16   Ht  (1.727 m)  General Appearance: Well developed well nourished, non-toxic appearing in no apparent distress. Eyes: PERRLA, EOMs, conjunctiva w/ no swelling or erythema or discharge Sinuses: No Frontal/maxillary tenderness ENT/Mouth: Ear canals clear without swelling or erythema.  TM's normal bilaterally with no retractions, bulging, or loss of landmarks.   Neck: Supple, thyroid normal, no notable JVD  Respiratory: Respiratory effort normal, Clear breath sounds anteriorly and posteriorly bilaterally without rales, rhonchi, wheezing or stridor. No  retractions or accessory muscle usage. Cardio: RRR with no MRGs.   Abdomen: Soft, + BS.  Non tender, no guarding, rebound, hernias, masses.  Musculoskeletal: Right knee with small bruise.  Non-tender to palpation.  Pain at the insertion of the patella tendon.  Normal ligamentous exam.  Pain with active knee extension which is relieved with pressure on patella tendon.  Full ROM, 5/5 strength, normal gait.  Skin: Warm, dry without rashes  Neuro: Awake and oriented X 3, Cranial nerves intact. Normal muscle tone, no cerebellar symptoms. Sensation intact.  Psych: normal affect, Insight and Judgment appropriate.     Terri Piedra, PA-C 4:59 PM [facility]

## 2017-07-27 ENCOUNTER — Encounter: Payer: Self-pay | Admitting: Physician Assistant

## 2017-08-29 ENCOUNTER — Encounter: Payer: Self-pay | Admitting: Physician Assistant

## 2017-08-29 MED ORDER — PREDNISONE 20 MG PO TABS: ORAL_TABLET | ORAL | 0 refills | Status: DC

## 2017-08-29 MED ORDER — CYCLOBENZAPRINE HCL 10 MG PO TABS: 10.0000 mg | ORAL_TABLET | Freq: Three times a day (TID) | ORAL | 0 refills | Status: DC | PRN

## 2017-08-29 NOTE — Progress Notes (Signed)
Complete Physical  Assessment and Plan:  Routine general medical examination at a health care facility 1 year  Cervical dysplasia Monitor  Anemia, unspecified type -     CBC with Differential/Platelet  Recurrent major depressive disorder, in partial remission (HCC) -     TSH  Allergic state, subsequent encounter  Screening cholesterol level -     Lipid panel  Medication management -     BASIC METABOLIC PANEL WITH GFR -     Hepatic function panel -     Magnesium  Screening for blood or protein in urine -     Urinalysis w microscopic + reflex cultur -     Microalbumin / creatinine urine ratio  Vitamin D deficiency -     VITAMIN D 25 Hydroxy (Vit-D Deficiency, Fractures)  Needs flu shot -     FLU VACCINE MDCK QUAD W/Preservative  Screening for cardiovascular condition -     EKG 12-Lead  Screen for colon cancer -     Ambulatory referral to Gastroenterology  Chronic right-sided low back pain without sciatica Hip exam good, neg straight leg, no signs of sciatica, follow up ortho Patient is very active and it will be good for her to get plugged in with sports medicine/ortho -     Ambulatory referral to Orthopedics  Prepatellar bursitis, right knee Continue prednisone, ice, rest, will refer to ortho -     Ambulatory referral to Orthopedics  Discussed med's effects and SE's. Screening labs and tests as requested with regular follow-up as recommended. Over 40 minutes of exam, counseling, chart review, and complex, high level critical decision making was performed this visit.   HPI  56 y.o. female  presents for a complete physic18al and follow up for has Cervical dysplasia; Anemia; Depression; and Allergy on her problem list..  Her blood pressure has been controlled at home, today their BP is BP: 122/90 She does workout. She denies chest pain, shortness of breath, dizziness. Had back pain x 24 years since birth of her son, has been seeing chiropractor, after an  adjustment, was getting in the car and had acute pain, sent in prednisone 09/17.  Seen ortho 10 years ago. She has pain right lower back with right hip around to her hip, no pain down her leg, no weakness. Larey Seat 03/2017, had patellar tendonitis, having pain with putting pressure on knee and with severe flexion.  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 She started on DHEA , she gets testosterone [place] 4% testosterone, pea size each ankle 4 x a week and she is on estrogen/progesterone.  Patient is on Vitamin D supplement.   Lab Results  Component Value Date   VD25OH 80 08/30/2016      She is not on cholesterol medication and denies myalgias. Her cholesterol is at goal. The cholesterol last visit was:  Lab Results  Component Value Date   CHOL 209 (H) 08/30/2016   HDL 89 08/30/2016   LDLCALC 106 08/30/2016   TRIG 70 08/30/2016   CHOLHDL 2.3 08/30/2016   BMI is Body mass index is 25.43 kg/m., she is working on diet and exercise. Wt Readings from Last 3 Encounters:  08/31/17 164 lb 12.8 oz (74.8 kg)  08/30/16 144 lb (65.3 kg)  07/15/16 138 lb (62.6 kg)    Current Medications:  Current Outpatient Prescriptions on File  Prior to Visit  Medication Sig Dispense Refill  . cyclobenzaprine (FLEXERIL) 10 MG tablet Take 1 tablet (10 mg total) by mouth 3 (three) times daily as needed for muscle spasms. 30 tablet 0  . predniSONE (DELTASONE) 20 MG tablet 2 tablets daily for 3 days, 1 tablet daily for 4 days. 10 tablet 0  . valACYclovir (VALTREX) 500 MG tablet Take 1 tablet (500 mg total) by mouth daily. 30 tablet 5   No current facility-administered medications on file prior to visit.    Allergies:  Allergies  Allergen Reactions  . Flagyl [Metronidazole] Rash   Medical History:  She has Cervical dysplasia; Anemia; Depression; and Allergy on her problem list.    Health Maintenance:   Tetanus:  2015 Pneumovax: Prevnar 13:  Flu vaccine: got today Zostavax: LMP: Post menopausal Pap: May 2018, had abnormal in 1989 at GYN, sees Joyce Gross [facility]) MGM:May 2018 3D   DEXA: N/A Colonoscopy: 1996 DUE EGD:  Patient Care Team: Lucky Cowboy, MD as PCP - General (Internal Medicine)  Surgical History:  She has a past surgical history that includes Breast surgery (2000); Rhinoplasty; Facial cosmetic surgery; and tummy tuck. Family History:  Herfamily history includes Cancer (age of onset: 68) in her mother; Cancer (age of onset: 13) in her father. Social History:  She reports that she has never smoked. She uses smokeless tobacco. She reports that she does not drink alcohol or use drugs.  Review of Systems: Review of Systems  Constitutional: Negative.   HENT: Negative.   Eyes: Negative.   Respiratory: Negative.   Cardiovascular: Negative.   Gastrointestinal: Negative.   Genitourinary: Negative.   Musculoskeletal: Positive for back pain and joint pain. Negative for falls, myalgias and neck pain.  Skin: Negative.   Neurological: Negative.   Endo/Heme/Allergies: Negative.   Psychiatric/Behavioral: Negative.     Physical Exam: Estimated body mass index is 25.43 kg/m as calculated from the following:   Height as of this encounter: 5' 7.5" (1.715 m).   Weight as of this encounter: 164 lb 12.8 oz (74.8 kg). BP 122/90   Pulse 70   Temp (!) 97.3 F (36.3 C)   Resp 18   Ht 5' 7.5" (1.715 m)   Wt 164 lb 12.8 oz (74.8 kg)   SpO2 98%   BMI 25.43 kg/m  General Appearance: Well nourished, in no apparent distress.  Eyes: PERRLA, EOMs, conjunctiva no swelling or erythema, normal fundi and vessels.  Sinuses: No Frontal/maxillary tenderness  ENT/Mouth: Ext aud canals clear, normal light reflex with TMs without erythema, bulging. Good dentition. No erythema, swelling, or exudate on post pharynx. Tonsils not swollen or erythematous. Hearing normal.   Neck: Supple, thyroid normal. No bruits  Respiratory: Respiratory effort normal, BS equal bilaterally without rales, rhonchi, wheezing or stridor.  Cardio: RRR without murmurs, rubs or gallops. Brisk peripheral pulses without edema.  Chest: symmetric, with normal excursions and percussion.  Breasts: defer GYN Abdomen: Soft, nontender, no guarding, rebound, hernias, masses, or organomegaly.  Lymphatics: Non tender without lymphadenopathy.  Genitourinary: defer gyn Musculoskeletal: Full ROM all peripheral extremities,5/5 strength, and antalgic gait, negative straight leg, limited flexion due to pain, normal hip exam full ROM no pain. Right knee with minor swelling at prepatellar bursa and patella tendon, slight laxity patella, normal ACL, MCL/LCL, no joint line tenderness, no effusion.  Skin: Warm, dry without rashes, lesions, ecchymosis. Neuro: Cranial nerves intact, reflexes equal bilaterally. Normal muscle tone, no cerebellar symptoms. Sensation intact.  Psych: Awake and oriented X 3, normal  affect, Insight and Judgment appropriate.   EKG: WNL no ST changes. AORTA SCAN: defer  Shelby Ramirez 9:15 AM [facility]

## 2017-08-31 ENCOUNTER — Ambulatory Visit (INDEPENDENT_AMBULATORY_CARE_PROVIDER_SITE_OTHER): Payer: BLUE CROSS/BLUE SHIELD | Admitting: Physician Assistant

## 2017-08-31 ENCOUNTER — Encounter: Payer: Self-pay | Admitting: Physician Assistant

## 2017-08-31 VITALS — BP 122/90 | HR 70 | Temp 97.3°F | Resp 18 | Ht 67.5 in | Wt 164.8 lb

## 2017-08-31 DIAGNOSIS — I10 Essential (primary) hypertension: Secondary | ICD-10-CM

## 2017-08-31 DIAGNOSIS — M545 Low back pain, unspecified: Secondary | ICD-10-CM

## 2017-08-31 DIAGNOSIS — Z1211 Encounter for screening for malignant neoplasm of colon: Secondary | ICD-10-CM

## 2017-08-31 DIAGNOSIS — G8929 Other chronic pain: Secondary | ICD-10-CM

## 2017-08-31 DIAGNOSIS — E559 Vitamin D deficiency, unspecified: Secondary | ICD-10-CM

## 2017-08-31 DIAGNOSIS — Z0001 Encounter for general adult medical examination with abnormal findings: Secondary | ICD-10-CM

## 2017-08-31 DIAGNOSIS — D649 Anemia, unspecified: Secondary | ICD-10-CM | POA: Diagnosis not present

## 2017-08-31 DIAGNOSIS — Z23 Encounter for immunization: Secondary | ICD-10-CM | POA: Diagnosis not present

## 2017-08-31 DIAGNOSIS — Z1389 Encounter for screening for other disorder: Secondary | ICD-10-CM

## 2017-08-31 DIAGNOSIS — R6889 Other general symptoms and signs: Secondary | ICD-10-CM | POA: Diagnosis not present

## 2017-08-31 DIAGNOSIS — Z0000 Encounter for general adult medical examination without abnormal findings: Secondary | ICD-10-CM

## 2017-08-31 DIAGNOSIS — T7840XD Allergy, unspecified, subsequent encounter: Secondary | ICD-10-CM

## 2017-08-31 DIAGNOSIS — F3341 Major depressive disorder, recurrent, in partial remission: Secondary | ICD-10-CM

## 2017-08-31 DIAGNOSIS — M7041 Prepatellar bursitis, right knee: Secondary | ICD-10-CM

## 2017-08-31 DIAGNOSIS — Z79899 Other long term (current) drug therapy: Secondary | ICD-10-CM

## 2017-08-31 DIAGNOSIS — Z13220 Encounter for screening for lipoid disorders: Secondary | ICD-10-CM

## 2017-08-31 DIAGNOSIS — N879 Dysplasia of cervix uteri, unspecified: Secondary | ICD-10-CM

## 2017-08-31 DIAGNOSIS — Z136 Encounter for screening for cardiovascular disorders: Secondary | ICD-10-CM

## 2017-08-31 MED ORDER — PREDNISONE 20 MG PO TABS: ORAL_TABLET | ORAL | 0 refills | Status: DC

## 2017-08-31 NOTE — Patient Instructions (Addendum)
Prepatellar Bursitis Prepatellar bursitis is inflammation of the prepatellar bursa. The prepatellar bursa is a fluid-filled sac that cushions the kneecap (patella). Prepatellar bursitis happens when fluid builds up in the this sac and causes it to get larger. The condition causes knee pain. What are the causes? This condition may be caused by:  Constant pressure on the knees from kneeling.  A hit to the knee.  Falling on the knee.  A bacterial infection.  Moving the knee often in a forceful way.  What increases the risk? This condition is more likely to develop in:  People who play a sport that involves a risk for falls on the knee or hard hits (blows) to the knee. These sports include: ? Football. ? Wrestling. ? Basketball. ? Soccer.  People who have to kneel for long periods of time, such as roofers, plumbers, and gardeners.  People with another inflammatory condition, such as gout or rheumatoid arthritis.  What are the signs or symptoms? The most common symptom of this condition is knee pain that gets better with rest. Other symptoms include:  Tenderness with activity.  Redness in the knee.  Inability to bend the knee or to kneel.  How is this diagnosed? This condition may be diagnosed based on:  Your symptoms.  Your medical history.  A physical exam. During the exam, your provider will compare your knees and check for tenderness and pain when moving your knee. Your health care provider may also use a needle to remove fluid from the bursa to help diagnose an infection.  Tests, such as: ? A blood test that checks for infection. ? X-rays. These may be taken to check the structure of the patella. ? MRI or ultrasound. These may be done to check for swelling and fluid buildup in the bursa.  How is this treated? This condition may be treated by:  Resting the knee.  Applying ice to the knee.  Medicine, such as: ? Nonsteroidal anti-inflammatory drugs (NSAIDs).  These can help to reduce pain and swelling. ? Antibiotic medicines. These may be needed if you have an infection. ? Steroid medicines. These may be prescribed if other treatments are not helping.  Raising (elevating) the knee while resting.  Doing strengthening and stretching exercises (physical therapy). These may be recommended after pain and swelling improve.  Having a procedure to remove fluid from the bursa. This may be done if other treatment is not helping.  Having surgery to remove the bursa. This may be done if you have a severe infection or if the condition keeps coming back after treatment.  Follow these instructions at home: Medicines  Take over-the-counter and prescription medicines only as told by your health care provider.  If you were prescribed an antibiotic medicine, take it as told by your health care provider. Do not stop taking the antibiotic even if you start to feel better. Managing pain, stiffness, and swelling  If directed, apply ice to your knee. ? Put ice in a plastic bag. ? Place a towel between your skin and the bag. ? Leave the ice on for 20 minutes, 2-3 times a day.  Elevate your knee above the level of your heart while you are sitting or lying down. Driving  Do not drive or operate heavy machinery while taking prescription pain medicine.  Ask your health care provider when it is safe fpr you to drive. Activity  Rest your knee.  Avoid activities that cause pain.  Return to your normal activities as told  by your health care provider. Ask your health care provider what activities are safe for you.  Do exercises as told by your health care provider. General instructions  Do not use the injured limb to support your body weight until your health care provider says that you can.  Do not use any tobacco products, such as cigarettes, chewing tobacco, and e-cigarettes. Tobacco can delay bone healing. If you need help quitting, ask your health care  provider.  Keep all follow-up visits as told by your health care provider. This is important. How is this prevented?  Warm up and stretch before being active.  Cool down and stretch after being active.  Give your body time to rest between periods of activity.  Make sure to use equipment that fits you.  Be safe and responsible while being active to avoid falls.  Do at least 150 minutes of moderate-intensity exercise each week, such as brisk walking or water aerobics.  Maintain physical fitness, including: ? Strength. ? Flexibility. ? Cardiovascular fitness. ? Endurance. Contact a health care provider if:  Your symptoms do not improve.  Your symptoms get worse.  Your symptoms keep coming back after treatment.  You develop a fever and have warmth, redness, and swelling over your knee. This information is not intended to replace advice given to you by your health care provider. Make sure you discuss any questions you have with your health care provider. Document Released: 11/29/2005 Document Revised: 08/03/2016 Document Reviewed: 08/29/2015 Elsevier Interactive Patient Education  [employer].   Try the exercises and other information in the back care manual, meloxicam once during the day as needed (avoid taking other NSAIDS like Alleve or Ibuprofen while taking this) and then flexeril if needed at bedtime for muscle spasm. This can be taken up to every 8 hours, but causes sedation, so should not drive or operate heavy machinery while taking this medicine.   Go to the ER if you have any new weakness in your legs, have trouble controlling your urine or bowels, or have worsening pain.   If you are not better in 1-3 month we will refer you to ortho   Back pain Rehab Ask your health care provider which exercises are safe for you. Do exercises exactly as told by your health care provider and adjust them as directed. It is normal to feel mild stretching, pulling, tightness, or  discomfort as you do these exercises, but you should stop right away if you feel sudden pain or your pain gets worse.Do not begin these exercises until told by your health care provider. Stretching and range of motion exercises These exercises warm up your muscles and joints and improve the movement and flexibility of your hips and your back. These exercises also help to relieve pain, numbness, and tingling. Exercise A: Sciatic nerve glide 1. Sit in a chair with your head facing down toward your chest. Place your hands behind your back. Let your shoulders slump forward. 2. Slowly straighten one of your knees while you tilt your head back as if you are looking toward the ceiling. Only straighten your leg as far as you can without making your symptoms worse. 3. Hold for __________ seconds. 4. Slowly return to the starting position. 5. Repeat with your other leg. Repeat __________ times. Complete this exercise __________ times a day. Exercise B: Knee to chest with hip adduction and internal rotation  1. Lie on your back on a firm surface with both legs straight. 2. Bend one  of your knees and move it up toward your chest until you feel a gentle stretch in your lower back and buttock. Then, move your knee toward the shoulder that is on the opposite side from your leg. ? Hold your leg in this position by holding onto the front of your knee. 3. Hold for __________ seconds. 4. Slowly return to the starting position. 5. Repeat with your other leg. Repeat __________ times. Complete this exercise __________ times a day. Exercise C: Prone extension on elbows  1. Lie on your abdomen on a firm surface. A bed may be too soft for this exercise. 2. Prop yourself up on your elbows. 3. Use your arms to help lift your chest up until you feel a gentle stretch in your abdomen and your lower back. ? This will place some of your body weight on your elbows. If this is uncomfortable, try stacking pillows under your  chest. ? Your hips should stay down, against the surface that you are lying on. Keep your hip and back muscles relaxed. 4. Hold for __________ seconds. 5. Slowly relax your upper body and return to the starting position. Repeat __________ times. Complete this exercise __________ times a day. Strengthening exercises These exercises build strength and endurance in your back. Endurance is the ability to use your muscles for a long time, even after they get tired. Exercise D: Pelvic tilt 1. Lie on your back on a firm surface. Bend your knees and keep your feet flat. 2. Tense your abdominal muscles. Tip your pelvis up toward the ceiling and flatten your lower back into the floor. ? To help with this exercise, you may place a small towel under your lower back and try to push your back into the towel. 3. Hold for __________ seconds. 4. Let your muscles relax completely before you repeat this exercise. Repeat __________ times. Complete this exercise __________ times a day. Exercise E: Alternating arm and leg raises  1. Get on your hands and knees on a firm surface. If you are on a hard floor, you may want to use padding to cushion your knees, such as an exercise mat. 2. Line up your arms and legs. Your hands should be below your shoulders, and your knees should be below your hips. 3. Lift your left leg behind you. At the same time, raise your right arm and straighten it in front of you. ? Do not lift your leg higher than your hip. ? Do not lift your arm higher than your shoulder. ? Keep your abdominal and back muscles tight. ? Keep your hips facing the ground. ? Do not arch your back. ? Keep your balance carefully, and do not hold your breath. 4. Hold for __________ seconds. 5. Slowly return to the starting position and repeat with your right leg and your left arm. Repeat __________ times. Complete this exercise __________ times a day. Posture and body mechanics  Body mechanics refers to the  movements and positions of your body while you do your daily activities. Posture is part of body mechanics. Good posture and healthy body mechanics can help to relieve stress in your body's tissues and joints. Good posture means that your spine is in its natural S-curve position (your spine is neutral), your shoulders are pulled back slightly, and your head is not tipped forward. The following are general guidelines for applying improved posture and body mechanics to your everyday activities. Standing   When standing, keep your spine neutral and your feet about  hip-width apart. Keep a slight bend in your knees. Your ears, shoulders, and hips should line up.  When you do a task in which you stand in one place for a long time, place one foot up on a stable object that is 2-4 inches (5-10 cm) high, such as a footstool. This helps keep your spine neutral. Sitting   When sitting, keep your spine neutral and keep your feet flat on the floor. Use a footrest, if necessary, and keep your thighs parallel to the floor. Avoid rounding your shoulders, and avoid tilting your head forward.  When working at a desk or a computer, keep your desk at a height where your hands are slightly lower than your elbows. Slide your chair under your desk so you are close enough to maintain good posture.  When working at a computer, place your monitor at a height where you are looking straight ahead and you do not have to tilt your head forward or downward to look at the screen. Resting   When lying down and resting, avoid positions that are most painful for you.  If you have pain with activities such as sitting, bending, stooping, or squatting (flexion-based activities), lie in a position in which your body does not bend very much. For example, avoid curling up on your side with your arms and knees near your chest (fetal position).  If you have pain with activities such as standing for a long time or reaching with your arms  (extension-based activities), lie with your spine in a neutral position and bend your knees slightly. Try the following positions: ? Lying on your side with a pillow between your knees. ? Lying on your back with a pillow under your knees. Lifting   When lifting objects, keep your feet at least shoulder-width apart and tighten your abdominal muscles.  Bend your knees and hips and keep your spine neutral. It is important to lift using the strength of your legs, not your back. Do not lock your knees straight out.  Always ask for help to lift heavy or awkward objects. This information is not intended to replace advice given to you by your health care provider. Make sure you discuss any questions you have with your health care provider. Document Released: 11/29/2005 Document Revised: 08/05/2016 Document Reviewed: 08/15/2015 Elsevier Interactive Patient Education  [employer].

## 2017-09-02 LAB — URINALYSIS W MICROSCOPIC + REFLEX CULTURE
BACTERIA UA: NONE SEEN /HPF
Bilirubin Urine: NEGATIVE
Glucose, UA: NEGATIVE
HYALINE CAST: NONE SEEN /LPF
Hgb urine dipstick: NEGATIVE
Ketones, ur: NEGATIVE
Leukocyte Esterase: NEGATIVE
Nitrites, Initial: NEGATIVE
PROTEIN: NEGATIVE
RBC / HPF: NONE SEEN /HPF (ref 0–2)
SPECIFIC GRAVITY, URINE: 1.019 (ref 1.001–1.03)
SQUAMOUS EPITHELIAL / LPF: NONE SEEN /HPF (ref <=5)
WBC, UA: NONE SEEN /HPF (ref 0–5)
pH: 6 (ref 5.0–8.0)

## 2017-09-02 LAB — NO CULTURE INDICATED

## 2017-09-02 LAB — BASIC METABOLIC PANEL WITH GFR
BUN / CREAT RATIO: 49 (calc) — AB (ref 6–22)
BUN: 32 mg/dL — ABNORMAL HIGH (ref 7–25)
CHLORIDE: 106 mmol/L (ref 98–110)
CO2: 30 mmol/L (ref 20–32)
CREATININE: 0.65 mg/dL (ref 0.50–1.05)
Calcium: 9.6 mg/dL (ref 8.6–10.4)
GFR, Est African American: 117 mL/min/{1.73_m2} (ref >=60)
GFR, Est Non African American: 101 mL/min/{1.73_m2} (ref >=60)
GLUCOSE: 76 mg/dL (ref 65–99)
POTASSIUM: 4.1 mmol/L (ref 3.5–5.3)
SODIUM: 143 mmol/L (ref 135–146)

## 2017-09-02 LAB — CBC WITH DIFFERENTIAL/PLATELET
BASOS PCT: 0.4 %
Basophils Absolute: 31 cells/uL (ref 0–200)
EOS ABS: 62 {cells}/uL (ref 15–500)
Eosinophils Relative: 0.8 %
HEMATOCRIT: 40.2 % (ref 35.0–45.0)
HEMOGLOBIN: 13.6 g/dL (ref 11.7–15.5)
LYMPHS ABS: 2090 {cells}/uL (ref 850–3900)
MCH: 31.1 pg (ref 27.0–33.0)
MCHC: 33.8 g/dL (ref 32.0–36.0)
MCV: 92 fL (ref 80.0–100.0)
MPV: 12.2 fL (ref 7.5–12.5)
Monocytes Relative: 11.5 %
NEUTROS ABS: 4719 {cells}/uL (ref 1500–7800)
Neutrophils Relative %: 60.5 %
Platelets: 220 10*3/uL (ref 140–400)
RBC: 4.37 10*6/uL (ref 3.80–5.10)
RDW: 12.4 % (ref 11.0–15.0)
TOTAL LYMPHOCYTE: 26.8 %
WBC: 7.8 10*3/uL (ref 3.8–10.8)
WBCMIX: 897 {cells}/uL (ref 200–950)

## 2017-09-02 LAB — HEPATIC FUNCTION PANEL
AG RATIO: 1.9 (calc) (ref 1.0–2.5)
ALT: 15 U/L (ref 6–29)
AST: 16 U/L (ref 10–35)
Albumin: 4.3 g/dL (ref 3.6–5.1)
Alkaline phosphatase (APISO): 98 U/L (ref 33–130)
BILIRUBIN INDIRECT: 0.2 mg/dL (ref 0.2–1.2)
Bilirubin, Direct: 0.1 mg/dL (ref 0.0–0.2)
GLOBULIN: 2.3 g/dL (ref 1.9–3.7)
TOTAL PROTEIN: 6.6 g/dL (ref 6.1–8.1)
Total Bilirubin: 0.3 mg/dL (ref 0.2–1.2)

## 2017-09-02 LAB — LIPID PANEL
CHOL/HDL RATIO: 2.6 (calc) (ref <5.0)
Cholesterol: 189 mg/dL (ref <200)
HDL: 73 mg/dL (ref >50)
LDL Cholesterol (Calc): 100 mg/dL (calc) — ABNORMAL HIGH
NON-HDL CHOLESTEROL (CALC): 116 mg/dL (ref <130)
TRIGLYCERIDES: 69 mg/dL (ref <150)

## 2017-09-02 LAB — MICROALBUMIN / CREATININE URINE RATIO
CREATININE, URINE: 51 mg/dL (ref 20–275)
MICROALB UR: 0.3 mg/dL
MICROALB/CREAT RATIO: 6 ug/mg{creat} (ref <30)

## 2017-09-02 LAB — MAGNESIUM: Magnesium: 2 mg/dL (ref 1.5–2.5)

## 2017-09-02 LAB — TSH: TSH: 1.72 mIU/L

## 2017-09-02 LAB — VITAMIN D 25 HYDROXY (VIT D DEFICIENCY, FRACTURES): Vit D, 25-Hydroxy: 54 ng/mL (ref 30–100)

## 2017-09-09 ENCOUNTER — Encounter: Payer: Self-pay | Admitting: Physician Assistant

## 2017-09-10 ENCOUNTER — Other Ambulatory Visit: Payer: Self-pay | Admitting: Physician Assistant

## 2017-09-10 DIAGNOSIS — Z79899 Other long term (current) drug therapy: Secondary | ICD-10-CM

## 2017-10-17 ENCOUNTER — Encounter: Payer: Self-pay | Admitting: Physician Assistant

## 2017-10-25 ENCOUNTER — Encounter: Payer: Self-pay | Admitting: Physician Assistant

## 2017-11-10 ENCOUNTER — Ambulatory Visit: Payer: Self-pay | Admitting: Physician Assistant

## 2017-11-10 NOTE — Progress Notes (Deleted)
   Subjective:    Patient ID: Shelby LoftBabette Patricelli, female    DOB: 02-13-1961, 56 y.o.   MRN: [id number]  HPI   There were no vitals taken for this visit.  Medications Current Outpatient Medications on File Prior to Visit  Medication Sig  . cyclobenzaprine (FLEXERIL) 10 MG tablet Take 1 tablet (10 mg total) by mouth 3 (three) times daily as needed for muscle spasms.  Marland Kitchen. estradiol (VIVELLE-DOT) 0.05 MG/24HR patch Place 1 patch onto the skin 2 (two) times a week.  . predniSONE (DELTASONE) 20 MG tablet 2 tablets daily for 3 days, 1 tablet daily for 4 days.  . progesterone (PROMETRIUM) 200 MG capsule Take 200 mg by mouth daily.  . TESTOSTERONE NA Place into the nose.  . valACYclovir (VALTREX) 500 MG tablet Take 1 tablet (500 mg total) by mouth daily.   No current facility-administered medications on file prior to visit.     Problem list She has Cervical dysplasia; Anemia; Depression; and Allergy on their problem list.   Review of Systems     Objective:   Physical Exam        Assessment & Plan:

## 2018-03-11 ENCOUNTER — Encounter: Payer: Self-pay | Admitting: Physician Assistant

## 2018-03-13 ENCOUNTER — Other Ambulatory Visit: Payer: Self-pay | Admitting: Physician Assistant

## 2018-03-13 DIAGNOSIS — L709 Acne, unspecified: Secondary | ICD-10-CM | POA: Insufficient documentation

## 2018-08-04 LAB — HM PAP SMEAR

## 2018-08-09 ENCOUNTER — Other Ambulatory Visit: Payer: Self-pay | Admitting: Obstetrics and Gynecology

## 2018-08-09 DIAGNOSIS — R928 Other abnormal and inconclusive findings on diagnostic imaging of breast: Secondary | ICD-10-CM

## 2018-08-11 ENCOUNTER — Other Ambulatory Visit: Payer: Self-pay

## 2018-08-23 ENCOUNTER — Ambulatory Visit
Admission: RE | Admit: 2018-08-23 | Discharge: 2018-08-23 | Disposition: A | Discharge status: Home / Self Care | Payer: BLUE CROSS/BLUE SHIELD | Source: Ambulatory Visit | Attending: Obstetrics and Gynecology | Admitting: Obstetrics and Gynecology

## 2018-08-23 ENCOUNTER — Other Ambulatory Visit: Payer: Self-pay | Admitting: Obstetrics and Gynecology

## 2018-08-23 DIAGNOSIS — N6310 Unspecified lump in the right breast, unspecified quadrant: Secondary | ICD-10-CM

## 2018-08-23 DIAGNOSIS — R928 Other abnormal and inconclusive findings on diagnostic imaging of breast: Secondary | ICD-10-CM

## 2018-09-04 NOTE — Progress Notes (Signed)
Complete Physical  Assessment and Plan: WILL REQUEST LABS FROM DR. BOVARD'S OFFICE- declines labs   Acne, unspecified acne type Continue follow up  Cervical dysplasia Continue follow up  Anemia, unspecified type monitoir  Recurrent major depressive disorder, in partial remission (HCC) -     amphetamine-dextroamphetamine (ADDERALL) 20 MG tablet; 1/2-1 pill a day for ADD - recent death, no SI/HI Has good support symptoms Will follow up 2-3 months - declines SSRI at this time  Allergic state, subsequent encounter  Vitamin D deficiency Continue supplement  Routine general medical examination at a health care facility 1 year  Attention deficit disorder (ADD) without hyperactivity -     amphetamine-dextroamphetamine (ADDERALL) 20 MG tablet; 1/2-1 pill a day for ADD  Screen for colon cancer -     Ambulatory referral to Gastroenterology       Discussed med's effects and SE's. Screening labs and tests as requested with regular follow-up as recommended. Over 40 minutes of exam, counseling, chart review, and complex, high level critical decision making was performed this visit.   HPI  57 y.o. female  presents for a complete physical and follow up for has Cervical dysplasia; Anemia; Depression; Allergy; and Acne on their problem list..  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 She is seeing someone in hospice, [place].   She is on ABX for adult acne.   She was having body aches, joint. She is forcing herself to go exercise which is helping. She is trying to figure out what she is doing next, may move to [place], unsure. She has history of ADHD and feels that at this time she can not have   Her blood pressure has been controlled at home, today their BP is BP: 110/70 She does workout. She denies chest pain, shortness of breath, dizziness. Had back pain x 24 years since birth of her son, has been seeing  chiropractor, after an adjustment, was getting in the car and had acute pain, sent in prednisone 09/17.  Seen ortho 10 years ago. She has pain right lower back with right hip around to her hip, no pain down her leg, no weakness. Larey Seat 03/2017, had patellar tendonitis, having pain with putting pressure on knee and with severe flexion.  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 She started on DHEA , she gets testosterone [place] 4% testosterone, pea size each ankle 4 x a week and she is on estrogen/progesterone.  Patient is on Vitamin D supplement.   Lab Results  Component Value Date   VD25OH 38 09/01/2017      She is not on cholesterol medication and denies myalgias. Her cholesterol is at goal. The cholesterol last visit was:  Lab Results  Component Value Date   CHOL 189 09/01/2017   HDL 73 09/01/2017   LDLCALC 100 (H) 09/01/2017   TRIG 69 09/01/2017   CHOLHDL 2.6 09/01/2017   BMI is Body mass index is 27.13 kg/m., she is working on diet and exercise. Wt Readings from Last 3 Encounters:  09/06/18 173 lb 3.2 oz (78.6 kg)  08/31/17 164 lb 12.8 oz (74.8 kg)  08/30/16 144 lb (65.3 kg)    Current Medications:  Current Outpatient Medications on File Prior to Visit  Medication Sig Dispense Refill  . estradiol (VIVELLE-DOT) 0.05 MG/24HR  patch Place 1 patch onto the skin 2 (two) times a week.    . progesterone (PROMETRIUM) 200 MG capsule Take 200 mg by mouth daily.    . TESTOSTERONE NA Place into the nose.    . valACYclovir (VALTREX) 500 MG tablet Take 1 tablet (500 mg total) by mouth daily. 30 tablet 5   No current facility-administered medications on file prior to visit.    Allergies:  Allergies  Allergen Reactions  . Flagyl [Metronidazole] Rash   Medical History:  She has Cervical dysplasia; Anemia; Depression; Allergy; and Acne on their problem list.    Health Maintenance:   Tetanus:  2015 Pneumovax: Prevnar 13:  Flu vaccine: delcines Zostavax: LMP: Post menopausal Pap: May 2019, had abnormal in 1989 [facility]YN[facility] MGM:May 2019 3D   DEXA: N/A Colonoscopy: 1996 DUE EGD:  Patient Care Team: Lucky CowboyMcKeown, William, MD as PCP - General (Internal Medicine)  Surgical History:  She has a past surgical history that includes Breast surgery (2000); Rhinoplasty; Facial cosmetic surgery; and tummy tuck. Family History:  Herfamily history includes Cancer (age of onset: 3353) in her mother; Cancer (age of onset: 3681) in her father. Social History:  She reports that she has never smoked. She uses smokeless tobacco. She reports that she does not drink alcohol or use drugs.  Review of Systems: Review of Systems  Constitutional: Negative.   HENT: Negative.   Eyes: Negative.   Respiratory: Negative.   Cardiovascular: Negative.   Gastrointestinal: Negative.   Genitourinary: Negative.   Musculoskeletal: Positive for back pain and joint pain. Negative for falls, myalgias and neck pain.  Skin: Negative.   Neurological: Negative.   Endo/Heme/Allergies: Negative.   Psychiatric/Behavioral: Negative.     Physical Exam: Estimated body mass index is 27.13 kg/m as calculated from the following:   Height as of this encounter: 5\' 7"  (1.702 m).   Weight as of this encounter: 173 lb 3.2 oz (78.6 kg). BP 110/70   Pulse 71   Temp 98.8 F (37.1 C)   Resp 14   Ht 5\' 7"  (1.702 m)   Wt 173 lb 3.2 oz (78.6 kg)   SpO2 98%   BMI 27.13 kg/m  General Appearance: Well nourished, in no apparent distress.  Eyes: PERRLA, EOMs, conjunctiva no swelling or erythema, normal fundi and vessels.  Sinuses: No Frontal/maxillary tenderness  ENT/Mouth: Ext aud canals clear, normal light reflex with TMs without erythema, bulging. Good dentition. No erythema, swelling, or exudate on post pharynx. Tonsils not swollen or erythematous. Hearing normal.  Neck: Supple,  thyroid normal. No bruits  Respiratory: Respiratory effort normal, BS equal bilaterally without rales, rhonchi, wheezing or stridor.  Cardio: RRR without murmurs, rubs or gallops. Brisk peripheral pulses without edema.  Chest: symmetric, with normal excursions and percussion.  Breasts: defer GYN Abdomen: Soft, nontender, no guarding, rebound, hernias, masses, or organomegaly.  Lymphatics: Non tender without lymphadenopathy.  Genitourinary: defer gyn Musculoskeletal: Full ROM all peripheral extremities,5/5 strength.  Skin: Warm, dry without rashes, lesions, ecchymosis. Neuro: Cranial nerves intact, reflexes equal bilaterally. Normal muscle tone, no cerebellar symptoms. Sensation intact.  Psych: Awake and oriented X 3, normal affect, Insight and Judgment appropriate. Tearful.    EKG: defer AORTA SCAN: defer  Shelby Ramirez 12:34 PM [facility]

## 2018-09-06 ENCOUNTER — Encounter: Payer: Self-pay | Admitting: Physician Assistant

## 2018-09-06 ENCOUNTER — Ambulatory Visit: Payer: BLUE CROSS/BLUE SHIELD | Admitting: Physician Assistant

## 2018-09-06 VITALS — BP 110/70 | HR 71 | Temp 98.8°F | Resp 14 | Ht 67.0 in | Wt 173.2 lb

## 2018-09-06 DIAGNOSIS — Z0000 Encounter for general adult medical examination without abnormal findings: Secondary | ICD-10-CM | POA: Diagnosis not present

## 2018-09-06 DIAGNOSIS — E559 Vitamin D deficiency, unspecified: Secondary | ICD-10-CM

## 2018-09-06 DIAGNOSIS — F3341 Major depressive disorder, recurrent, in partial remission: Secondary | ICD-10-CM

## 2018-09-06 DIAGNOSIS — T7840XD Allergy, unspecified, subsequent encounter: Secondary | ICD-10-CM

## 2018-09-06 DIAGNOSIS — Z79899 Other long term (current) drug therapy: Secondary | ICD-10-CM

## 2018-09-06 DIAGNOSIS — Z1389 Encounter for screening for other disorder: Secondary | ICD-10-CM

## 2018-09-06 DIAGNOSIS — L709 Acne, unspecified: Secondary | ICD-10-CM

## 2018-09-06 DIAGNOSIS — N879 Dysplasia of cervix uteri, unspecified: Secondary | ICD-10-CM

## 2018-09-06 DIAGNOSIS — F988 Other specified behavioral and emotional disorders with onset usually occurring in childhood and adolescence: Secondary | ICD-10-CM

## 2018-09-06 DIAGNOSIS — D649 Anemia, unspecified: Secondary | ICD-10-CM

## 2018-09-06 DIAGNOSIS — Z1211 Encounter for screening for malignant neoplasm of colon: Secondary | ICD-10-CM

## 2018-09-06 DIAGNOSIS — Z13220 Encounter for screening for lipoid disorders: Secondary | ICD-10-CM

## 2018-09-06 MED ORDER — AMPHETAMINE-DEXTROAMPHETAMINE 20 MG PO TABS: ORAL_TABLET | ORAL | 0 refills | Status: DC

## 2018-09-06 NOTE — Patient Instructions (Addendum)
Will send in referall Colon cancer is 3rd most diagnosed cancer and 2nd leading cause of death in both men and women 55 years of age and older despite being one of the most preventable and treatable cancers if found early.  4 of out 5 people diagnosed with colon cancer have NO prior family history.  When caught EARLY 90% of colon cancer is curable.      Will start adderall Take as needed It is addictive   Coping With Loss, Adult People experience loss in many different ways throughout their lives. Events such as moving, changing jobs, and losing friends can create a sense of loss. The loss may be as serious as a major health change, divorce, death of a pet, or death of a loved one. All of these types of loss are likely to create a physical and emotional reaction known as grief. Grief is the result of a major change or an absence of something or someone that you count on. Grief is a normal reaction to loss. How to recognize changes A variety of factors can affect your grieving experience, including:  The nature of your loss.  Your relationship to what or whom you lost.  Your understanding of grief and how to cope with it.  Your support system.  The way that you deal with your grief will affect your ability to function as you normally do. When you are grieving, you may experience:  Numbness, shock, sadness, anxiety, anger, denial, and guilt.  Thoughts about death.  Unexpected crying.  A physical sensation of emptiness in your gut.  Problems sleeping and eating.  Fatigue.  Loss of interest in normal activities.  Dreaming about or imagining seeing the person who died.  A need to remember what or whom you lost.  Difficulty thinking about anything other than your loss for a period of time.  Relief. If you have been expecting the loss for a while, you may feel a sense of relief when it happens.  Where to find support To get support for coping with loss:  Ask your health  care provider for help and recommendations, such as grief counseling or therapy.  Think about joining a support group for people who are coping with loss.  Follow these instructions at home:  Be patient with yourself and others. Allow the grieving process to happen, and remember that grieving takes time. ? It is likely that you may never feel completely done with some grief. You may find a way to move on while still cherishing memories and feelings about your loss. ? Accepting your loss is a process. It can take months or longer to adjust.  Express your feelings in healthy ways, such as: ? Talking with others about your loss. It may be helpful to find others who have had a similar loss, such as a support group. ? Writing down your feelings in a journal. ? Doing physical activities to release stress and emotional energy. ? Doing creative activities like painting, sculpting, or playing or listening to music. ? Practicing resilience. This is the ability to recover and adjust after facing challenges. Reading some resources that encourage resilience may help you to learn ways to practice those behaviors.  Keep to your normal routine as much as possible. If you have trouble focusing or doing normal activities, it is acceptable to take some time away from your normal routine.  Spend time with friends and loved ones.  Eat a healthy diet, get plenty of sleep, and  rest when you feel tired. Where to find more information: You can find more information about coping with loss from:  American Society of Clinical Oncology: www.cancer.net  American Psychological Association: DiceTour[employer].ca  Contact a health care provider if:  Your grief is extreme and keeps getting worse.  You have ongoing grief that does not improve.  Your body shows symptoms of grief, such as illness.  You feel depressed, anxious, or lonely. Get help right away if:  You have thoughts about hurting yourself or others. If you  ever feel like you may hurt yourself or others, or have thoughts about taking your own life, get help right away. You can go to your nearest emergency department or call:  Your local emergency services (911 in the [place]  A suicide crisis helpline, such as the National Suicide Prevention Lifeline at 225-319-1489. This is open 24 hours a day.  Summary  Grief is a normal part of experiencing a loss. It is the result of a major change or an absence of something or someone that you count on.  The depth of grief and the period of recovery depend on the type of loss as well as your ability to adjust to the change and process your feelings.  Processing grief requires patience and a willingness to accept your feelings and talk about your loss with people who are supportive.  It is important to find resources that work for you and to realize that we are all different when it comes to grief. There is not one single grieving process that works for everyone in the same way.  Be aware that when grief becomes extreme, it can lead to more severe issues like isolation, depression, anxiety, or suicidal thoughts. Talk with your health care provider if you have any of these issues. This information is not intended to replace advice given to you by your health care provider. Make sure you discuss any questions you have with your health care provider. Document Released: 04/14/2017 Document Revised: 04/14/2017 Document Reviewed: 04/14/2017 Elsevier Interactive Patient Education  2018 [employer].

## 2018-10-10 ENCOUNTER — Encounter: Payer: Self-pay | Admitting: Internal Medicine

## 2018-10-28 NOTE — Progress Notes (Signed)
   Subjective:    Patient ID: Shelby Ramirez, female    DOB: [birth year], 57 y.o.   MRN: [id number]  HPI 57 y.o. WF presents for follow up for depression.   Lost her significant other suddenly, started on adderall for depression/ADD last visit. She is on adderall 20mg , take 1/2 pill almost every day, states it is helping her energy, helps with focusing on her job search.   She has had a cold x 1 1/2 weeks, started sinus cold and now is in her lungs, she is on mucinex with productive cough, cough is keeping her awake, tried delsum night time. No fever, chills. No SOB, wheezing.   Blood pressure 116/80, pulse 68, temperature 98.9 F (37.2 C), resp. rate 18, height 5\' 7"  (1.702 m), weight 162 lb 12.8 oz (73.8 kg), SpO2 98 %.  Medications Current Outpatient Medications on File Prior to Visit  Medication Sig  . estradiol (VIVELLE-DOT) 0.05 MG/24HR patch Place 1 patch onto the skin 2 (two) times a week.  . progesterone (PROMETRIUM) 200 MG capsule Take 200 mg by mouth daily.  . TESTOSTERONE NA Place into the nose.  . valACYclovir (VALTREX) 500 MG tablet Take 1 tablet (500 mg total) by mouth daily.   No current facility-administered medications on file prior to visit.     Problem list She has Cervical dysplasia; Anemia; Depression; Allergy; and Acne on their problem list.     Review of Systems  Constitutional: Negative for chills, diaphoresis and fever.  HENT: Positive for congestion, postnasal drip, sinus pressure and sneezing. Negative for ear pain and sore throat.   Respiratory: Positive for cough. Negative for chest tightness, shortness of breath and wheezing.   Cardiovascular: Negative.   Gastrointestinal: Negative.   Genitourinary: Negative.   Musculoskeletal: Negative for neck pain.  Neurological: Positive for headaches.       Objective:   Physical Exam  Constitutional: She appears well-developed and well-nourished.  HENT:  Head: Normocephalic and atraumatic.  Right Ear:  External ear normal.  Nose: Right sinus exhibits maxillary sinus tenderness. Right sinus exhibits no frontal sinus tenderness. Left sinus exhibits maxillary sinus tenderness. Left sinus exhibits no frontal sinus tenderness.  Eyes: Conjunctivae and EOM are normal.  Neck: Normal range of motion. Neck supple.  Cardiovascular: Normal rate, regular rhythm, normal heart sounds and intact distal pulses.  Pulmonary/Chest: Effort normal and breath sounds normal. No respiratory distress. She has no wheezes.  Abdominal: Soft. Bowel sounds are normal.  Lymphadenopathy:    She has cervical adenopathy.  Skin: Skin is warm and dry.          Assessment & Plan:  Shelby Ramirez was seen today for follow-up.  Diagnoses and all orders for this visit:  Cough -     promethazine-dextromethorphan (PROMETHAZINE-DM) 6.25-15 MG/5ML syrup; Take 5 mLs by mouth 4 (four) times daily as needed for cough. -     azithromycin (ZITHROMAX) 250 MG tablet; Take 2 tablets (500 mg) on  Day 1,  followed by 1 tablet (250 mg) once daily on Days 2 through 5.  Recurrent major depressive disorder, in partial remission (HCC) -     amphetamine-dextroamphetamine (ADDERALL) 20 MG tablet; 1/2-1 pill a day for ADD  Attention deficit disorder (ADD) without hyperactivity -     amphetamine-dextroamphetamine (ADDERALL) 20 MG tablet; 1/2-1 pill a day for ADD   Future Appointments  Date Time Provider Department Center  09/28/2019  9:30 AM Quentin Mullingollier, Melia Hopes, PA-C [facility] None

## 2018-10-30 ENCOUNTER — Ambulatory Visit: Payer: BLUE CROSS/BLUE SHIELD | Admitting: Physician Assistant

## 2018-10-30 ENCOUNTER — Encounter: Payer: Self-pay | Admitting: Physician Assistant

## 2018-10-30 VITALS — BP 116/80 | HR 68 | Temp 98.9°F | Resp 18 | Ht 67.0 in | Wt 162.8 lb

## 2018-10-30 DIAGNOSIS — F3341 Major depressive disorder, recurrent, in partial remission: Secondary | ICD-10-CM | POA: Diagnosis not present

## 2018-10-30 DIAGNOSIS — R05 Cough: Secondary | ICD-10-CM

## 2018-10-30 DIAGNOSIS — F988 Other specified behavioral and emotional disorders with onset usually occurring in childhood and adolescence: Secondary | ICD-10-CM

## 2018-10-30 DIAGNOSIS — R059 Cough, unspecified: Secondary | ICD-10-CM

## 2018-10-30 MED ORDER — AMPHETAMINE-DEXTROAMPHETAMINE 20 MG PO TABS: ORAL_TABLET | ORAL | 0 refills | Status: DC

## 2018-10-30 MED ORDER — AZITHROMYCIN 250 MG PO TABS: ORAL_TABLET | ORAL | 1 refills | Status: AC

## 2018-10-30 MED ORDER — PROMETHAZINE-DM 6.25-15 MG/5ML PO SYRP: 5.0000 mL | ORAL_SOLUTION | Freq: Four times a day (QID) | ORAL | 1 refills | Status: DC | PRN

## 2018-10-30 NOTE — Patient Instructions (Signed)
HOW TO TREAT VIRAL COUGH AND COLD SYMPTOMS:  -Symptoms usually last at least 1 week with the worst symptoms being around day 4.  - colds usually start with a sore throat and end with a cough, and the cough can take 2 weeks to get better.  -No antibiotics are needed for colds, flu, sore throats, cough, bronchitis UNLESS symptoms are longer than 7 days OR if you are getting better then get drastically worse.  -There are a lot of combination medications (Dayquil, Nyquil, Vicks 44, tyelnol cold and sinus, ETC). Please look at the ingredients on the back so that you are treating the correct symptoms and not doubling up on medications/ingredients.    Medicines you can use  Nasal congestion  Little Remedies saline spray (aerosol/mist)- can try this, it is in the kids section - pseudoephedrine (Sudafed)- behind the counter, do not use if you have high blood pressure, medicine that have -D in them.  - phenylephrine (Sudafed PE) -Dextormethorphan + chlorpheniramine (Coridcidin HBP)- okay if you have high blood pressure -Oxymetazoline (Afrin) nasal spray- LIMIT to 3 days -Saline nasal spray -Neti pot (used distilled or bottled water)  Ear pain/congestion  -pseudoephedrine (sudafed) - Nasonex/flonase nasal spray  Fever  -Acetaminophen (Tyelnol) -Ibuprofen (Advil, motrin, aleve)  Sore Throat  -Acetaminophen (Tyelnol) -Ibuprofen (Advil, motrin, aleve) -Drink a lot of water -Gargle with salt water - Rest your voice (don't talk) -Throat sprays -Cough drops  Body Aches  -Acetaminophen (Tyelnol) -Ibuprofen (Advil, motrin, aleve)  Headache  -Acetaminophen (Tyelnol) -Ibuprofen (Advil, motrin, aleve) - Exedrin, Exedrin Migraine  Allergy symptoms (cough, sneeze, runny nose, itchy eyes) -Claritin or loratadine cheapest but likely the weakest  -Zyrtec or certizine at night because it can make you sleepy -The strongest is allegra or fexafinadine  Cheapest at [employer], sam's,  [employer]co  Cough  -Dextromethorphan (Delsym)- medicine that has DM in it -Guafenesin (Mucinex/Robitussin) - cough drops - drink lots of water  Chest Congestion  -Guafenesin (Mucinex/Robitussin)  Red Itchy Eyes  - Naphcon-A  Upset Stomach  - Bland diet (nothing spicy, greasy, fried, and high acid foods like tomatoes, oranges, berries) -OKAY- cereal, bread, soup, crackers, rice -Eat smaller more frequent meals -reduce caffeine, no alcohol -Loperamide (Imodium-AD) if diarrhea -Prevacid for heart burn  General health when sick  -Hydration -wash your hands frequently -keep surfaces clean -change pillow cases and sheets often -Get fresh air but do not exercise strenuously -Vitamin D, double up on it - Vitamin C -Zinc   Counseling services  Here are some numbers below you can try but I suggest calling your insurance and finding out who is in your network and THEN calling those people or looking them up on google.   I'm a big fan of Cognitive Behavioral Therapy, look this up on You tube or check with the therapist you see if they are certified.  This form of therapy helps to teach you skills to better handle with current situation that are causing anxiety or depression.   [facility] Adolphus Birchwood, NP [phone] [address]  Suite [phone] Fax [phone]  [facility] Hours: Monday-Thursday 830-8pm  Friday 830AM-7PM Address: [address] [phone]  [facility].  Address: [address] [place], [place] [address] Phone-[phone]  [facility] [phone] office [email or web address] [address], [address] [place], [place], [place] [address]  Dr. Vilinda Flake, Ph.D. [address] [place] [address] Phone: [phone]  [facility]  Raina MinaDowda, M.Ed [phone], [place] [address]   Franchot ErichsenErik Nelson, MA, clinical  psychologist  Cognitive-Behavior Therapy; Mood Disorders; Anxiety Disorders; adult and child ADHD; Family Therapy; Stress Management; personal growth, and Marital Therapy.    Carlus Pavlovennis McKnight Ph.D., clinical psychologist Cognitive-Behavior Therapy; Mood Disorders; Anxiety Disorders; Stress     Management  [facility] [address], [place], [place] [address] [phone]   [facility] Sheran LuzDesiree Wilkinson, psychotherapist [address] [place], [place] [address] [phone]  Miguel AschoffElaine Talbert Ph.D., clinical psychologist [phone] office [address] [place], [place] [address] Cognitive Behavior Therapy, Depression, Bipolar, Anxiety, Grief and Loss

## 2018-11-15 ENCOUNTER — Encounter: Payer: Self-pay | Admitting: Physician Assistant

## 2018-12-18 ENCOUNTER — Encounter: Payer: Self-pay | Admitting: Internal Medicine

## 2019-03-12 ENCOUNTER — Other Ambulatory Visit: Payer: Self-pay

## 2019-03-12 DIAGNOSIS — F988 Other specified behavioral and emotional disorders with onset usually occurring in childhood and adolescence: Secondary | ICD-10-CM

## 2019-03-12 DIAGNOSIS — F3341 Major depressive disorder, recurrent, in partial remission: Secondary | ICD-10-CM

## 2019-03-13 MED ORDER — AMPHETAMINE-DEXTROAMPHETAMINE 20 MG PO TABS: ORAL_TABLET | ORAL | 0 refills | Status: DC

## 2019-05-14 ENCOUNTER — Other Ambulatory Visit: Payer: Self-pay

## 2019-05-14 DIAGNOSIS — F3341 Major depressive disorder, recurrent, in partial remission: Secondary | ICD-10-CM

## 2019-05-14 DIAGNOSIS — F988 Other specified behavioral and emotional disorders with onset usually occurring in childhood and adolescence: Secondary | ICD-10-CM

## 2019-05-15 MED ORDER — AMPHETAMINE-DEXTROAMPHETAMINE 20 MG PO TABS: ORAL_TABLET | ORAL | 0 refills | Status: DC

## 2019-08-07 ENCOUNTER — Other Ambulatory Visit: Payer: Self-pay

## 2019-08-07 DIAGNOSIS — F988 Other specified behavioral and emotional disorders with onset usually occurring in childhood and adolescence: Secondary | ICD-10-CM

## 2019-08-07 DIAGNOSIS — F3341 Major depressive disorder, recurrent, in partial remission: Secondary | ICD-10-CM

## 2019-08-08 MED ORDER — AMPHETAMINE-DEXTROAMPHETAMINE 20 MG PO TABS: ORAL_TABLET | ORAL | 0 refills | Status: DC

## 2019-09-28 ENCOUNTER — Encounter: Payer: Self-pay | Admitting: Physician Assistant

## 2019-10-22 NOTE — Progress Notes (Deleted)
Complete Physical  Assessment and Plan: WILL REQUEST LABS FROM DR. BOVARD'S OFFICE- declines labs   Acne, unspecified acne type Continue follow up  Cervical dysplasia Continue follow up  Anemia, unspecified type monitoir  Recurrent major depressive disorder, in partial remission (HCC) -     amphetamine-dextroamphetamine (ADDERALL) 20 MG tablet; 1/2-1 pill a day for ADD - recent death, no SI/HI Has good support symptoms Will follow up 2-3 months - declines SSRI at this time  Allergic state, subsequent encounter  Vitamin D deficiency Continue supplement  Routine general medical examination at a health care facility 1 year  Attention deficit disorder (ADD) without hyperactivity -     amphetamine-dextroamphetamine (ADDERALL) 20 MG tablet; 1/2-1 pill a day for ADD  Screen for colon cancer -     Ambulatory referral to Gastroenterology     Discussed med's effects and SE's. Screening labs and tests as requested with regular follow-up as recommended. Over 40 minutes of exam, counseling, chart review, and complex, high level critical decision making was performed this visit.   HPI  58 y.o. female  presents for a complete physical and follow up for has Cervical dysplasia; Anemia; Depression; Allergy; and Acne on their problem list..  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 She is seeing someone in hospice, [place].   She is on ABX for adult acne.   She was having body aches, joint. She is forcing herself to go exercise which is helping. She is trying to figure out what she is doing next, may move to [place], unsure. She has history of ADHD and feels that at this time she can not have   Her blood pressure has been controlled at home, today their BP is   She does workout. She denies chest pain, shortness of breath, dizziness.  Had back pain x 24 years since birth of her son, has been seeing chiropractor,  after an adjustment, was getting in the car and had acute pain, sent in prednisone 09/17.  Seen ortho 10 years ago. She has pain right lower back with right hip around to her hip, no pain down her leg, no weakness.   She is a Pharmacist, hospital at [employer], adjunct professor  She is post menopausal x 1 year. She started on DHEA , and is on testosterone [place] 4% testosterone, pea size each ankle 4 x a week and she is on estrogen/progesterone.  Patient is on Vitamin D supplement.   Lab Results  Component Value Date   VD25OH 26 09/01/2017      She is not on cholesterol medication and denies myalgias. Her cholesterol is at goal. The cholesterol last visit was:  Lab Results  Component Value Date   CHOL 189 09/01/2017   HDL 73 09/01/2017   LDLCALC 100 (H) 09/01/2017   TRIG 69 09/01/2017   CHOLHDL 2.6 09/01/2017   BMI is There is no height or weight on file to calculate BMI., she is working on diet and exercise. Wt Readings from Last 3 Encounters:  10/30/18 162 lb 12.8 oz (73.8 kg)  09/06/18 173 lb 3.2 oz (78.6 kg)  08/31/17 164 lb 12.8 oz (74.8 kg)    Current Medications:  Current Outpatient Medications on File Prior to Visit  Medication Sig Dispense Refill  . amphetamine-dextroamphetamine (ADDERALL) 20 MG tablet 1/2-1 pill a day for ADD 30 tablet 0  . estradiol (VIVELLE-DOT) 0.05 MG/24HR patch Place 1 patch onto the  skin 2 (two) times a week.    . progesterone (PROMETRIUM) 200 MG capsule Take 200 mg by mouth daily.    . promethazine-dextromethorphan (PROMETHAZINE-DM) 6.25-15 MG/5ML syrup Take 5 mLs by mouth 4 (four) times daily as needed for cough. 240 mL 1  . TESTOSTERONE NA Place into the nose.    . valACYclovir (VALTREX) 500 MG tablet Take 1 tablet (500 mg total) by mouth daily. 30 tablet 5   No current facility-administered medications on file prior to visit.    Allergies:  Allergies  Allergen Reactions  . Flagyl [Metronidazole] Rash   Medical History:  She has Cervical  dysplasia; Anemia; Depression; Allergy; and Acne on their problem list.   Health Maintenance:   Tetanus:  2015 Pneumovax: Prevnar 13:  Flu vaccine: delcines Zostavax: LMP: Post menopausal Pap: May 2019, had abnormal in 1989 at GYN, sees Joyce [facility]) MGM:May 2019 3D   DEXA: N/A Colonoscopy: 1996 DUE EGD:  Patient Care Team: Lucky Cowboy, MD as PCP - General (Internal Medicine)  Surgical History:  She has a past surgical history that includes Breast surgery (2000); Rhinoplasty; Facial cosmetic surgery; and tummy tuck. Family History:  Herfamily history includes Cancer (age of onset: 62) in her mother; Cancer (age of onset: 54) in her father. Social History:  She reports that she has never smoked. She uses smokeless tobacco. She reports that she does not drink alcohol or use drugs.  Review of Systems: Review of Systems  Constitutional: Negative.   HENT: Negative.   Eyes: Negative.   Respiratory: Negative.   Cardiovascular: Negative.   Gastrointestinal: Negative.   Genitourinary: Negative.   Musculoskeletal: Positive for back pain and joint pain. Negative for falls, myalgias and neck pain.  Skin: Negative.   Neurological: Negative.   Endo/Heme/Allergies: Negative.   Psychiatric/Behavioral: Negative.     Physical Exam: Estimated body mass index is 25.5 kg/m as calculated from the following:   Height as of 10/30/18: 5\' 7"  (1.702 m).   Weight as of 10/30/18: 162 lb 12.8 oz (73.8 kg). There were no vitals taken for this visit. General Appearance: Well nourished, in no apparent distress.  Eyes: PERRLA, EOMs, conjunctiva no swelling or erythema, normal fundi and vessels.  Sinuses: No Frontal/maxillary tenderness  ENT/Mouth: Ext aud canals clear, normal light reflex with TMs without erythema, bulging. Good dentition. No erythema, swelling, or exudate on post pharynx. Tonsils not swollen or erythematous. Hearing normal.  Neck: Supple, thyroid normal. No bruits   Respiratory: Respiratory effort normal, BS equal bilaterally without rales, rhonchi, wheezing or stridor.  Cardio: RRR without murmurs, rubs or gallops. Brisk peripheral pulses without edema.  Chest: symmetric, with normal excursions and percussion.  Breasts: defer GYN Abdomen: Soft, nontender, no guarding, rebound, hernias, masses, or organomegaly.  Lymphatics: Non tender without lymphadenopathy.  Genitourinary: defer gyn Musculoskeletal: Full ROM all peripheral extremities,5/5 strength.  Skin: Warm, dry without rashes, lesions, ecchymosis. Neuro: Cranial nerves intact, reflexes equal bilaterally. Normal muscle tone, no cerebellar symptoms. Sensation intact.  Psych: Awake and oriented X 3, normal affect, Insight and Judgment appropriate. Tearful.    EKG: defer AORTA SCAN: defer  11/01/18 8:21 AM [facility]

## 2019-10-23 ENCOUNTER — Encounter: Payer: Self-pay | Admitting: Physician Assistant

## 2019-10-23 DIAGNOSIS — Z0000 Encounter for general adult medical examination without abnormal findings: Secondary | ICD-10-CM

## 2020-01-16 DIAGNOSIS — F988 Other specified behavioral and emotional disorders with onset usually occurring in childhood and adolescence: Secondary | ICD-10-CM

## 2020-01-16 DIAGNOSIS — F3341 Major depressive disorder, recurrent, in partial remission: Secondary | ICD-10-CM

## 2020-01-17 MED ORDER — AMPHETAMINE-DEXTROAMPHETAMINE 20 MG PO TABS: ORAL_TABLET | ORAL | 0 refills | Status: DC

## 2020-09-29 ENCOUNTER — Encounter: Payer: BLUE CROSS/BLUE SHIELD | Admitting: Physician Assistant

## 2020-10-22 ENCOUNTER — Encounter: Payer: BLUE CROSS/BLUE SHIELD | Admitting: Physician Assistant

## 2021-12-16 DIAGNOSIS — M9903 Segmental and somatic dysfunction of lumbar region: Secondary | ICD-10-CM | POA: Diagnosis not present

## 2021-12-16 DIAGNOSIS — M9902 Segmental and somatic dysfunction of thoracic region: Secondary | ICD-10-CM | POA: Diagnosis not present

## 2021-12-16 DIAGNOSIS — M9901 Segmental and somatic dysfunction of cervical region: Secondary | ICD-10-CM | POA: Diagnosis not present

## 2021-12-16 DIAGNOSIS — M9904 Segmental and somatic dysfunction of sacral region: Secondary | ICD-10-CM | POA: Diagnosis not present

## 2021-12-23 DIAGNOSIS — M9904 Segmental and somatic dysfunction of sacral region: Secondary | ICD-10-CM | POA: Diagnosis not present

## 2021-12-23 DIAGNOSIS — M9903 Segmental and somatic dysfunction of lumbar region: Secondary | ICD-10-CM | POA: Diagnosis not present

## 2021-12-23 DIAGNOSIS — M9902 Segmental and somatic dysfunction of thoracic region: Secondary | ICD-10-CM | POA: Diagnosis not present

## 2021-12-23 DIAGNOSIS — M9901 Segmental and somatic dysfunction of cervical region: Secondary | ICD-10-CM | POA: Diagnosis not present

## 2021-12-30 DIAGNOSIS — M9902 Segmental and somatic dysfunction of thoracic region: Secondary | ICD-10-CM | POA: Diagnosis not present

## 2021-12-30 DIAGNOSIS — M9901 Segmental and somatic dysfunction of cervical region: Secondary | ICD-10-CM | POA: Diagnosis not present

## 2021-12-30 DIAGNOSIS — M9903 Segmental and somatic dysfunction of lumbar region: Secondary | ICD-10-CM | POA: Diagnosis not present

## 2021-12-30 DIAGNOSIS — M9904 Segmental and somatic dysfunction of sacral region: Secondary | ICD-10-CM | POA: Diagnosis not present

## 2022-01-06 DIAGNOSIS — M9901 Segmental and somatic dysfunction of cervical region: Secondary | ICD-10-CM | POA: Diagnosis not present

## 2022-01-06 DIAGNOSIS — M9903 Segmental and somatic dysfunction of lumbar region: Secondary | ICD-10-CM | POA: Diagnosis not present

## 2022-01-06 DIAGNOSIS — M9904 Segmental and somatic dysfunction of sacral region: Secondary | ICD-10-CM | POA: Diagnosis not present

## 2022-01-06 DIAGNOSIS — M9902 Segmental and somatic dysfunction of thoracic region: Secondary | ICD-10-CM | POA: Diagnosis not present

## 2022-01-20 DIAGNOSIS — M9904 Segmental and somatic dysfunction of sacral region: Secondary | ICD-10-CM | POA: Diagnosis not present

## 2022-01-20 DIAGNOSIS — M9902 Segmental and somatic dysfunction of thoracic region: Secondary | ICD-10-CM | POA: Diagnosis not present

## 2022-01-20 DIAGNOSIS — M9903 Segmental and somatic dysfunction of lumbar region: Secondary | ICD-10-CM | POA: Diagnosis not present

## 2022-01-20 DIAGNOSIS — M9901 Segmental and somatic dysfunction of cervical region: Secondary | ICD-10-CM | POA: Diagnosis not present

## 2022-02-10 DIAGNOSIS — M9904 Segmental and somatic dysfunction of sacral region: Secondary | ICD-10-CM | POA: Diagnosis not present

## 2022-02-10 DIAGNOSIS — M9903 Segmental and somatic dysfunction of lumbar region: Secondary | ICD-10-CM | POA: Diagnosis not present

## 2022-02-10 DIAGNOSIS — M9902 Segmental and somatic dysfunction of thoracic region: Secondary | ICD-10-CM | POA: Diagnosis not present

## 2022-02-10 DIAGNOSIS — M9901 Segmental and somatic dysfunction of cervical region: Secondary | ICD-10-CM | POA: Diagnosis not present

## 2022-03-01 DIAGNOSIS — M9904 Segmental and somatic dysfunction of sacral region: Secondary | ICD-10-CM | POA: Diagnosis not present

## 2022-03-01 DIAGNOSIS — M9903 Segmental and somatic dysfunction of lumbar region: Secondary | ICD-10-CM | POA: Diagnosis not present

## 2022-03-01 DIAGNOSIS — M9901 Segmental and somatic dysfunction of cervical region: Secondary | ICD-10-CM | POA: Diagnosis not present

## 2022-03-01 DIAGNOSIS — M9902 Segmental and somatic dysfunction of thoracic region: Secondary | ICD-10-CM | POA: Diagnosis not present

## 2022-03-10 DIAGNOSIS — M9901 Segmental and somatic dysfunction of cervical region: Secondary | ICD-10-CM | POA: Diagnosis not present

## 2022-03-10 DIAGNOSIS — M9904 Segmental and somatic dysfunction of sacral region: Secondary | ICD-10-CM | POA: Diagnosis not present

## 2022-03-10 DIAGNOSIS — M9902 Segmental and somatic dysfunction of thoracic region: Secondary | ICD-10-CM | POA: Diagnosis not present

## 2022-03-10 DIAGNOSIS — M9903 Segmental and somatic dysfunction of lumbar region: Secondary | ICD-10-CM | POA: Diagnosis not present

## 2022-03-17 DIAGNOSIS — M9902 Segmental and somatic dysfunction of thoracic region: Secondary | ICD-10-CM | POA: Diagnosis not present

## 2022-03-17 DIAGNOSIS — M9904 Segmental and somatic dysfunction of sacral region: Secondary | ICD-10-CM | POA: Diagnosis not present

## 2022-03-17 DIAGNOSIS — M9901 Segmental and somatic dysfunction of cervical region: Secondary | ICD-10-CM | POA: Diagnosis not present

## 2022-03-17 DIAGNOSIS — M9903 Segmental and somatic dysfunction of lumbar region: Secondary | ICD-10-CM | POA: Diagnosis not present

## 2022-03-24 DIAGNOSIS — M9903 Segmental and somatic dysfunction of lumbar region: Secondary | ICD-10-CM | POA: Diagnosis not present

## 2022-03-24 DIAGNOSIS — M9901 Segmental and somatic dysfunction of cervical region: Secondary | ICD-10-CM | POA: Diagnosis not present

## 2022-03-24 DIAGNOSIS — M9902 Segmental and somatic dysfunction of thoracic region: Secondary | ICD-10-CM | POA: Diagnosis not present

## 2022-03-24 DIAGNOSIS — M9904 Segmental and somatic dysfunction of sacral region: Secondary | ICD-10-CM | POA: Diagnosis not present

## 2022-03-31 DIAGNOSIS — M9903 Segmental and somatic dysfunction of lumbar region: Secondary | ICD-10-CM | POA: Diagnosis not present

## 2022-03-31 DIAGNOSIS — M9901 Segmental and somatic dysfunction of cervical region: Secondary | ICD-10-CM | POA: Diagnosis not present

## 2022-03-31 DIAGNOSIS — M9902 Segmental and somatic dysfunction of thoracic region: Secondary | ICD-10-CM | POA: Diagnosis not present

## 2022-03-31 DIAGNOSIS — M9904 Segmental and somatic dysfunction of sacral region: Secondary | ICD-10-CM | POA: Diagnosis not present

## 2022-04-07 DIAGNOSIS — Z124 Encounter for screening for malignant neoplasm of cervix: Secondary | ICD-10-CM | POA: Diagnosis not present

## 2022-04-14 DIAGNOSIS — M9902 Segmental and somatic dysfunction of thoracic region: Secondary | ICD-10-CM | POA: Diagnosis not present

## 2022-04-14 DIAGNOSIS — M9901 Segmental and somatic dysfunction of cervical region: Secondary | ICD-10-CM | POA: Diagnosis not present

## 2022-04-14 DIAGNOSIS — M9903 Segmental and somatic dysfunction of lumbar region: Secondary | ICD-10-CM | POA: Diagnosis not present

## 2022-04-14 DIAGNOSIS — M9904 Segmental and somatic dysfunction of sacral region: Secondary | ICD-10-CM | POA: Diagnosis not present

## 2022-04-19 DIAGNOSIS — Z1231 Encounter for screening mammogram for malignant neoplasm of breast: Secondary | ICD-10-CM | POA: Diagnosis not present

## 2022-04-21 DIAGNOSIS — M9904 Segmental and somatic dysfunction of sacral region: Secondary | ICD-10-CM | POA: Diagnosis not present

## 2022-04-21 DIAGNOSIS — M9903 Segmental and somatic dysfunction of lumbar region: Secondary | ICD-10-CM | POA: Diagnosis not present

## 2022-04-21 DIAGNOSIS — M9902 Segmental and somatic dysfunction of thoracic region: Secondary | ICD-10-CM | POA: Diagnosis not present

## 2022-04-21 DIAGNOSIS — M9901 Segmental and somatic dysfunction of cervical region: Secondary | ICD-10-CM | POA: Diagnosis not present

## 2022-04-27 ENCOUNTER — Other Ambulatory Visit: Payer: Self-pay | Admitting: Gynecology

## 2022-04-27 DIAGNOSIS — R928 Other abnormal and inconclusive findings on diagnostic imaging of breast: Secondary | ICD-10-CM

## 2022-04-28 DIAGNOSIS — M9904 Segmental and somatic dysfunction of sacral region: Secondary | ICD-10-CM | POA: Diagnosis not present

## 2022-04-28 DIAGNOSIS — M9903 Segmental and somatic dysfunction of lumbar region: Secondary | ICD-10-CM | POA: Diagnosis not present

## 2022-04-28 DIAGNOSIS — M9901 Segmental and somatic dysfunction of cervical region: Secondary | ICD-10-CM | POA: Diagnosis not present

## 2022-04-28 DIAGNOSIS — M9902 Segmental and somatic dysfunction of thoracic region: Secondary | ICD-10-CM | POA: Diagnosis not present

## 2022-05-01 ENCOUNTER — Ambulatory Visit
Admission: RE | Admit: 2022-05-01 | Discharge: 2022-05-01 | Disposition: A | Discharge status: Home / Self Care | Payer: BLUE CROSS/BLUE SHIELD | Source: Ambulatory Visit | Attending: Gynecology | Admitting: Gynecology

## 2022-05-01 ENCOUNTER — Ambulatory Visit
Admission: RE | Admit: 2022-05-01 | Discharge: 2022-05-01 | Disposition: A | Discharge status: Home / Self Care | Payer: 59 | Source: Ambulatory Visit | Attending: Gynecology | Admitting: Gynecology

## 2022-05-01 ENCOUNTER — Other Ambulatory Visit: Payer: Self-pay | Admitting: Gynecology

## 2022-05-01 DIAGNOSIS — R928 Other abnormal and inconclusive findings on diagnostic imaging of breast: Secondary | ICD-10-CM

## 2022-05-01 DIAGNOSIS — N6323 Unspecified lump in the left breast, lower outer quadrant: Secondary | ICD-10-CM | POA: Diagnosis not present

## 2022-05-01 DIAGNOSIS — N6321 Unspecified lump in the left breast, upper outer quadrant: Secondary | ICD-10-CM | POA: Diagnosis not present

## 2022-05-01 DIAGNOSIS — N6320 Unspecified lump in the left breast, unspecified quadrant: Secondary | ICD-10-CM

## 2022-05-04 ENCOUNTER — Ambulatory Visit
Admission: RE | Admit: 2022-05-04 | Discharge: 2022-05-04 | Disposition: A | Discharge status: Home / Self Care | Payer: 59 | Source: Ambulatory Visit | Attending: Gynecology | Admitting: Gynecology

## 2022-05-04 DIAGNOSIS — D242 Benign neoplasm of left breast: Secondary | ICD-10-CM | POA: Diagnosis not present

## 2022-05-04 DIAGNOSIS — N6325 Unspecified lump in the left breast, overlapping quadrants: Secondary | ICD-10-CM | POA: Diagnosis not present

## 2022-05-04 DIAGNOSIS — N6320 Unspecified lump in the left breast, unspecified quadrant: Secondary | ICD-10-CM

## 2022-05-04 DIAGNOSIS — N6012 Diffuse cystic mastopathy of left breast: Secondary | ICD-10-CM | POA: Diagnosis not present

## 2022-05-05 DIAGNOSIS — M9904 Segmental and somatic dysfunction of sacral region: Secondary | ICD-10-CM | POA: Diagnosis not present

## 2022-05-05 DIAGNOSIS — M9903 Segmental and somatic dysfunction of lumbar region: Secondary | ICD-10-CM | POA: Diagnosis not present

## 2022-05-05 DIAGNOSIS — M9902 Segmental and somatic dysfunction of thoracic region: Secondary | ICD-10-CM | POA: Diagnosis not present

## 2022-05-05 DIAGNOSIS — M9901 Segmental and somatic dysfunction of cervical region: Secondary | ICD-10-CM | POA: Diagnosis not present

## 2022-05-12 DIAGNOSIS — M9901 Segmental and somatic dysfunction of cervical region: Secondary | ICD-10-CM | POA: Diagnosis not present

## 2022-05-12 DIAGNOSIS — M9903 Segmental and somatic dysfunction of lumbar region: Secondary | ICD-10-CM | POA: Diagnosis not present

## 2022-05-12 DIAGNOSIS — M9902 Segmental and somatic dysfunction of thoracic region: Secondary | ICD-10-CM | POA: Diagnosis not present

## 2022-05-12 DIAGNOSIS — M9904 Segmental and somatic dysfunction of sacral region: Secondary | ICD-10-CM | POA: Diagnosis not present

## 2022-05-19 DIAGNOSIS — M9902 Segmental and somatic dysfunction of thoracic region: Secondary | ICD-10-CM | POA: Diagnosis not present

## 2022-05-19 DIAGNOSIS — M9904 Segmental and somatic dysfunction of sacral region: Secondary | ICD-10-CM | POA: Diagnosis not present

## 2022-05-19 DIAGNOSIS — M9901 Segmental and somatic dysfunction of cervical region: Secondary | ICD-10-CM | POA: Diagnosis not present

## 2022-05-19 DIAGNOSIS — M9903 Segmental and somatic dysfunction of lumbar region: Secondary | ICD-10-CM | POA: Diagnosis not present

## 2022-05-26 DIAGNOSIS — M9904 Segmental and somatic dysfunction of sacral region: Secondary | ICD-10-CM | POA: Diagnosis not present

## 2022-05-26 DIAGNOSIS — M9901 Segmental and somatic dysfunction of cervical region: Secondary | ICD-10-CM | POA: Diagnosis not present

## 2022-05-26 DIAGNOSIS — M9902 Segmental and somatic dysfunction of thoracic region: Secondary | ICD-10-CM | POA: Diagnosis not present

## 2022-05-26 DIAGNOSIS — M9903 Segmental and somatic dysfunction of lumbar region: Secondary | ICD-10-CM | POA: Diagnosis not present

## 2022-06-02 DIAGNOSIS — M9904 Segmental and somatic dysfunction of sacral region: Secondary | ICD-10-CM | POA: Diagnosis not present

## 2022-06-02 DIAGNOSIS — M9902 Segmental and somatic dysfunction of thoracic region: Secondary | ICD-10-CM | POA: Diagnosis not present

## 2022-06-02 DIAGNOSIS — M9903 Segmental and somatic dysfunction of lumbar region: Secondary | ICD-10-CM | POA: Diagnosis not present

## 2022-06-02 DIAGNOSIS — M9901 Segmental and somatic dysfunction of cervical region: Secondary | ICD-10-CM | POA: Diagnosis not present

## 2022-06-08 DIAGNOSIS — M9901 Segmental and somatic dysfunction of cervical region: Secondary | ICD-10-CM | POA: Diagnosis not present

## 2022-06-08 DIAGNOSIS — M9903 Segmental and somatic dysfunction of lumbar region: Secondary | ICD-10-CM | POA: Diagnosis not present

## 2022-06-08 DIAGNOSIS — M9904 Segmental and somatic dysfunction of sacral region: Secondary | ICD-10-CM | POA: Diagnosis not present

## 2022-06-08 DIAGNOSIS — M9902 Segmental and somatic dysfunction of thoracic region: Secondary | ICD-10-CM | POA: Diagnosis not present

## 2022-06-14 DIAGNOSIS — M9904 Segmental and somatic dysfunction of sacral region: Secondary | ICD-10-CM | POA: Diagnosis not present

## 2022-06-14 DIAGNOSIS — M9903 Segmental and somatic dysfunction of lumbar region: Secondary | ICD-10-CM | POA: Diagnosis not present

## 2022-06-14 DIAGNOSIS — M9901 Segmental and somatic dysfunction of cervical region: Secondary | ICD-10-CM | POA: Diagnosis not present

## 2022-06-14 DIAGNOSIS — M9902 Segmental and somatic dysfunction of thoracic region: Secondary | ICD-10-CM | POA: Diagnosis not present

## 2022-06-23 DIAGNOSIS — M9901 Segmental and somatic dysfunction of cervical region: Secondary | ICD-10-CM | POA: Diagnosis not present

## 2022-06-23 DIAGNOSIS — M9903 Segmental and somatic dysfunction of lumbar region: Secondary | ICD-10-CM | POA: Diagnosis not present

## 2022-06-23 DIAGNOSIS — M9904 Segmental and somatic dysfunction of sacral region: Secondary | ICD-10-CM | POA: Diagnosis not present

## 2022-06-23 DIAGNOSIS — M9902 Segmental and somatic dysfunction of thoracic region: Secondary | ICD-10-CM | POA: Diagnosis not present

## 2022-06-30 DIAGNOSIS — M9904 Segmental and somatic dysfunction of sacral region: Secondary | ICD-10-CM | POA: Diagnosis not present

## 2022-06-30 DIAGNOSIS — F339 Major depressive disorder, recurrent, unspecified: Secondary | ICD-10-CM | POA: Diagnosis not present

## 2022-06-30 DIAGNOSIS — M9901 Segmental and somatic dysfunction of cervical region: Secondary | ICD-10-CM | POA: Diagnosis not present

## 2022-06-30 DIAGNOSIS — M9903 Segmental and somatic dysfunction of lumbar region: Secondary | ICD-10-CM | POA: Diagnosis not present

## 2022-06-30 DIAGNOSIS — M9902 Segmental and somatic dysfunction of thoracic region: Secondary | ICD-10-CM | POA: Diagnosis not present

## 2022-06-30 DIAGNOSIS — R69 Illness, unspecified: Secondary | ICD-10-CM | POA: Diagnosis not present

## 2022-07-01 DIAGNOSIS — F901 Attention-deficit hyperactivity disorder, predominantly hyperactive type: Secondary | ICD-10-CM | POA: Diagnosis not present

## 2022-07-01 DIAGNOSIS — R69 Illness, unspecified: Secondary | ICD-10-CM | POA: Diagnosis not present

## 2022-07-05 DIAGNOSIS — R69 Illness, unspecified: Secondary | ICD-10-CM | POA: Diagnosis not present

## 2022-07-05 DIAGNOSIS — F901 Attention-deficit hyperactivity disorder, predominantly hyperactive type: Secondary | ICD-10-CM | POA: Diagnosis not present

## 2022-07-07 DIAGNOSIS — M9904 Segmental and somatic dysfunction of sacral region: Secondary | ICD-10-CM | POA: Diagnosis not present

## 2022-07-07 DIAGNOSIS — M9901 Segmental and somatic dysfunction of cervical region: Secondary | ICD-10-CM | POA: Diagnosis not present

## 2022-07-07 DIAGNOSIS — M9902 Segmental and somatic dysfunction of thoracic region: Secondary | ICD-10-CM | POA: Diagnosis not present

## 2022-07-07 DIAGNOSIS — M9903 Segmental and somatic dysfunction of lumbar region: Secondary | ICD-10-CM | POA: Diagnosis not present

## 2022-07-13 DIAGNOSIS — F901 Attention-deficit hyperactivity disorder, predominantly hyperactive type: Secondary | ICD-10-CM | POA: Diagnosis not present

## 2022-07-13 DIAGNOSIS — R69 Illness, unspecified: Secondary | ICD-10-CM | POA: Diagnosis not present

## 2022-07-14 ENCOUNTER — Other Ambulatory Visit: Payer: Self-pay | Admitting: General Surgery

## 2022-07-14 DIAGNOSIS — N6321 Unspecified lump in the left breast, upper outer quadrant: Secondary | ICD-10-CM

## 2022-07-14 DIAGNOSIS — D242 Benign neoplasm of left breast: Secondary | ICD-10-CM | POA: Diagnosis not present

## 2022-07-14 DIAGNOSIS — M9903 Segmental and somatic dysfunction of lumbar region: Secondary | ICD-10-CM | POA: Diagnosis not present

## 2022-07-14 DIAGNOSIS — M9901 Segmental and somatic dysfunction of cervical region: Secondary | ICD-10-CM | POA: Diagnosis not present

## 2022-07-14 DIAGNOSIS — M9904 Segmental and somatic dysfunction of sacral region: Secondary | ICD-10-CM | POA: Diagnosis not present

## 2022-07-14 DIAGNOSIS — M9902 Segmental and somatic dysfunction of thoracic region: Secondary | ICD-10-CM | POA: Diagnosis not present

## 2022-07-15 DIAGNOSIS — R69 Illness, unspecified: Secondary | ICD-10-CM | POA: Diagnosis not present

## 2022-07-15 DIAGNOSIS — F902 Attention-deficit hyperactivity disorder, combined type: Secondary | ICD-10-CM | POA: Diagnosis not present

## 2022-07-15 DIAGNOSIS — F339 Major depressive disorder, recurrent, unspecified: Secondary | ICD-10-CM | POA: Diagnosis not present

## 2022-07-16 ENCOUNTER — Other Ambulatory Visit: Payer: Self-pay | Admitting: General Surgery

## 2022-07-16 DIAGNOSIS — N6321 Unspecified lump in the left breast, upper outer quadrant: Secondary | ICD-10-CM

## 2022-07-20 DIAGNOSIS — F411 Generalized anxiety disorder: Secondary | ICD-10-CM | POA: Diagnosis not present

## 2022-07-20 DIAGNOSIS — R69 Illness, unspecified: Secondary | ICD-10-CM | POA: Diagnosis not present

## 2022-07-20 DIAGNOSIS — F902 Attention-deficit hyperactivity disorder, combined type: Secondary | ICD-10-CM | POA: Diagnosis not present

## 2022-07-21 DIAGNOSIS — M9903 Segmental and somatic dysfunction of lumbar region: Secondary | ICD-10-CM | POA: Diagnosis not present

## 2022-07-21 DIAGNOSIS — M9901 Segmental and somatic dysfunction of cervical region: Secondary | ICD-10-CM | POA: Diagnosis not present

## 2022-07-21 DIAGNOSIS — M9902 Segmental and somatic dysfunction of thoracic region: Secondary | ICD-10-CM | POA: Diagnosis not present

## 2022-07-21 DIAGNOSIS — M9904 Segmental and somatic dysfunction of sacral region: Secondary | ICD-10-CM | POA: Diagnosis not present

## 2022-07-27 DIAGNOSIS — F902 Attention-deficit hyperactivity disorder, combined type: Secondary | ICD-10-CM | POA: Diagnosis not present

## 2022-07-27 DIAGNOSIS — R69 Illness, unspecified: Secondary | ICD-10-CM | POA: Diagnosis not present

## 2022-07-27 DIAGNOSIS — F411 Generalized anxiety disorder: Secondary | ICD-10-CM | POA: Diagnosis not present

## 2022-07-28 DIAGNOSIS — M9902 Segmental and somatic dysfunction of thoracic region: Secondary | ICD-10-CM | POA: Diagnosis not present

## 2022-07-28 DIAGNOSIS — M9904 Segmental and somatic dysfunction of sacral region: Secondary | ICD-10-CM | POA: Diagnosis not present

## 2022-07-28 DIAGNOSIS — M9901 Segmental and somatic dysfunction of cervical region: Secondary | ICD-10-CM | POA: Diagnosis not present

## 2022-07-28 DIAGNOSIS — M9903 Segmental and somatic dysfunction of lumbar region: Secondary | ICD-10-CM | POA: Diagnosis not present

## 2022-08-04 DIAGNOSIS — M9902 Segmental and somatic dysfunction of thoracic region: Secondary | ICD-10-CM | POA: Diagnosis not present

## 2022-08-04 DIAGNOSIS — M9904 Segmental and somatic dysfunction of sacral region: Secondary | ICD-10-CM | POA: Diagnosis not present

## 2022-08-04 DIAGNOSIS — M9903 Segmental and somatic dysfunction of lumbar region: Secondary | ICD-10-CM | POA: Diagnosis not present

## 2022-08-04 DIAGNOSIS — F411 Generalized anxiety disorder: Secondary | ICD-10-CM | POA: Diagnosis not present

## 2022-08-04 DIAGNOSIS — F902 Attention-deficit hyperactivity disorder, combined type: Secondary | ICD-10-CM | POA: Diagnosis not present

## 2022-08-04 DIAGNOSIS — M9901 Segmental and somatic dysfunction of cervical region: Secondary | ICD-10-CM | POA: Diagnosis not present

## 2022-08-04 DIAGNOSIS — R69 Illness, unspecified: Secondary | ICD-10-CM | POA: Diagnosis not present

## 2022-08-05 ENCOUNTER — Encounter (HOSPITAL_BASED_OUTPATIENT_CLINIC_OR_DEPARTMENT_OTHER): Payer: Self-pay | Admitting: General Surgery

## 2022-08-05 ENCOUNTER — Other Ambulatory Visit: Payer: Self-pay

## 2022-08-05 DIAGNOSIS — Z0142 Encounter for cervical smear to confirm findings of recent normal smear following initial abnormal smear: Secondary | ICD-10-CM | POA: Diagnosis not present

## 2022-08-05 DIAGNOSIS — Z1272 Encounter for screening for malignant neoplasm of vagina: Secondary | ICD-10-CM | POA: Diagnosis not present

## 2022-08-05 DIAGNOSIS — R69 Illness, unspecified: Secondary | ICD-10-CM | POA: Diagnosis not present

## 2022-08-05 DIAGNOSIS — R87615 Unsatisfactory cytologic smear of cervix: Secondary | ICD-10-CM | POA: Diagnosis not present

## 2022-08-05 LAB — HM PAP SMEAR

## 2022-08-05 LAB — RESULTS CONSOLE HPV: CHL HPV: NEGATIVE

## 2022-08-09 DIAGNOSIS — L821 Other seborrheic keratosis: Secondary | ICD-10-CM | POA: Diagnosis not present

## 2022-08-09 DIAGNOSIS — D2261 Melanocytic nevi of right upper limb, including shoulder: Secondary | ICD-10-CM | POA: Diagnosis not present

## 2022-08-09 DIAGNOSIS — L603 Nail dystrophy: Secondary | ICD-10-CM | POA: Diagnosis not present

## 2022-08-09 DIAGNOSIS — D2272 Melanocytic nevi of left lower limb, including hip: Secondary | ICD-10-CM | POA: Diagnosis not present

## 2022-08-09 DIAGNOSIS — L700 Acne vulgaris: Secondary | ICD-10-CM | POA: Diagnosis not present

## 2022-08-09 DIAGNOSIS — D1801 Hemangioma of skin and subcutaneous tissue: Secondary | ICD-10-CM | POA: Diagnosis not present

## 2022-08-09 DIAGNOSIS — L570 Actinic keratosis: Secondary | ICD-10-CM | POA: Diagnosis not present

## 2022-08-09 DIAGNOSIS — D225 Melanocytic nevi of trunk: Secondary | ICD-10-CM | POA: Diagnosis not present

## 2022-08-10 DIAGNOSIS — F902 Attention-deficit hyperactivity disorder, combined type: Secondary | ICD-10-CM | POA: Diagnosis not present

## 2022-08-10 DIAGNOSIS — F339 Major depressive disorder, recurrent, unspecified: Secondary | ICD-10-CM | POA: Diagnosis not present

## 2022-08-10 DIAGNOSIS — R69 Illness, unspecified: Secondary | ICD-10-CM | POA: Diagnosis not present

## 2022-08-11 ENCOUNTER — Ambulatory Visit
Admission: RE | Admit: 2022-08-11 | Discharge: 2022-08-11 | Disposition: A | Discharge status: Home / Self Care | Payer: 59 | Source: Ambulatory Visit | Attending: General Surgery | Admitting: General Surgery

## 2022-08-11 DIAGNOSIS — R928 Other abnormal and inconclusive findings on diagnostic imaging of breast: Secondary | ICD-10-CM | POA: Diagnosis not present

## 2022-08-11 DIAGNOSIS — N6321 Unspecified lump in the left breast, upper outer quadrant: Secondary | ICD-10-CM

## 2022-08-12 ENCOUNTER — Ambulatory Visit (HOSPITAL_BASED_OUTPATIENT_CLINIC_OR_DEPARTMENT_OTHER): Payer: 59 | Admitting: Anesthesiology

## 2022-08-12 ENCOUNTER — Encounter (HOSPITAL_BASED_OUTPATIENT_CLINIC_OR_DEPARTMENT_OTHER): Payer: Self-pay | Admitting: General Surgery

## 2022-08-12 ENCOUNTER — Other Ambulatory Visit: Payer: Self-pay

## 2022-08-12 ENCOUNTER — Ambulatory Visit (HOSPITAL_BASED_OUTPATIENT_CLINIC_OR_DEPARTMENT_OTHER)
Admission: RE | Admit: 2022-08-12 | Discharge: 2022-08-12 | Disposition: A | Discharge status: Home / Self Care | Payer: 59 | Attending: General Surgery | Admitting: General Surgery

## 2022-08-12 ENCOUNTER — Encounter (HOSPITAL_BASED_OUTPATIENT_CLINIC_OR_DEPARTMENT_OTHER)
Admission: RE | Disposition: A | Discharge status: Home / Self Care | Payer: Self-pay | Source: Home / Self Care | Attending: General Surgery

## 2022-08-12 ENCOUNTER — Ambulatory Visit
Admission: RE | Admit: 2022-08-12 | Discharge: 2022-08-12 | Disposition: A | Discharge status: Home / Self Care | Payer: 59 | Source: Ambulatory Visit | Attending: General Surgery | Admitting: General Surgery

## 2022-08-12 DIAGNOSIS — N6320 Unspecified lump in the left breast, unspecified quadrant: Secondary | ICD-10-CM

## 2022-08-12 DIAGNOSIS — R69 Illness, unspecified: Secondary | ICD-10-CM | POA: Diagnosis not present

## 2022-08-12 DIAGNOSIS — N6321 Unspecified lump in the left breast, upper outer quadrant: Secondary | ICD-10-CM

## 2022-08-12 DIAGNOSIS — N6082 Other benign mammary dysplasias of left breast: Secondary | ICD-10-CM | POA: Insufficient documentation

## 2022-08-12 DIAGNOSIS — N6012 Diffuse cystic mastopathy of left breast: Secondary | ICD-10-CM | POA: Diagnosis not present

## 2022-08-12 DIAGNOSIS — F418 Other specified anxiety disorders: Secondary | ICD-10-CM | POA: Insufficient documentation

## 2022-08-12 DIAGNOSIS — D242 Benign neoplasm of left breast: Secondary | ICD-10-CM | POA: Diagnosis not present

## 2022-08-12 DIAGNOSIS — R928 Other abnormal and inconclusive findings on diagnostic imaging of breast: Secondary | ICD-10-CM | POA: Diagnosis not present

## 2022-08-12 HISTORY — DX: Anxiety disorder, unspecified: F41.9

## 2022-08-12 HISTORY — PX: RADIOACTIVE SEED GUIDED EXCISIONAL BREAST BIOPSY: SHX6490

## 2022-08-12 SURGERY — RADIOACTIVE SEED GUIDED BREAST BIOPSY
Anesthesia: General | Site: Breast | Laterality: Left

## 2022-08-12 MED ORDER — FENTANYL CITRATE (PF) 100 MCG/2ML IJ SOLN: 25.0000 ug | INTRAMUSCULAR | Status: DC | PRN

## 2022-08-12 MED ORDER — DEXAMETHASONE SODIUM PHOSPHATE 10 MG/ML IJ SOLN
INTRAMUSCULAR | Status: AC
  Filled 2022-08-12: qty 1

## 2022-08-12 MED ORDER — LACTATED RINGERS IV SOLN
INTRAVENOUS | Status: DC
Start: 2022-08-12 — End: 2022-08-12

## 2022-08-12 MED ORDER — CHLORHEXIDINE GLUCONATE CLOTH 2 % EX PADS: 6.0000 | MEDICATED_PAD | Freq: Once | CUTANEOUS | Status: DC

## 2022-08-12 MED ORDER — ACETAMINOPHEN 500 MG PO TABS
1000.0000 mg | ORAL_TABLET | ORAL | Status: AC
  Administered 2022-08-12: 1000 mg via ORAL

## 2022-08-12 MED ORDER — MIDAZOLAM HCL 5 MG/5ML IJ SOLN
INTRAMUSCULAR | Status: DC | PRN
  Administered 2022-08-12: 2 mg via INTRAVENOUS

## 2022-08-12 MED ORDER — ACETAMINOPHEN 10 MG/ML IV SOLN: 1000.0000 mg | Freq: Once | INTRAVENOUS | Status: DC | PRN

## 2022-08-12 MED ORDER — MIDAZOLAM HCL 2 MG/2ML IJ SOLN
INTRAMUSCULAR | Status: AC
  Filled 2022-08-12: qty 2

## 2022-08-12 MED ORDER — FENTANYL CITRATE (PF) 100 MCG/2ML IJ SOLN
INTRAMUSCULAR | Status: DC | PRN
  Administered 2022-08-12: 50 ug via INTRAVENOUS
  Administered 2022-08-12 (×2): 25 ug via INTRAVENOUS

## 2022-08-12 MED ORDER — CEFAZOLIN SODIUM-DEXTROSE 2-4 GM/100ML-% IV SOLN
INTRAVENOUS | Status: AC
  Filled 2022-08-12: qty 100

## 2022-08-12 MED ORDER — OXYCODONE HCL 5 MG PO TABS
ORAL_TABLET | ORAL | Status: AC
  Filled 2022-08-12: qty 1

## 2022-08-12 MED ORDER — EPHEDRINE 5 MG/ML INJ
INTRAVENOUS | Status: AC
  Filled 2022-08-12: qty 5

## 2022-08-12 MED ORDER — ONDANSETRON HCL 4 MG/2ML IJ SOLN
INTRAMUSCULAR | Status: DC | PRN
  Administered 2022-08-12: 4 mg via INTRAVENOUS

## 2022-08-12 MED ORDER — CELECOXIB 200 MG PO CAPS
ORAL_CAPSULE | ORAL | Status: AC
  Filled 2022-08-12: qty 2

## 2022-08-12 MED ORDER — LIDOCAINE 2% (20 MG/ML) 5 ML SYRINGE
INTRAMUSCULAR | Status: DC | PRN
  Administered 2022-08-12: 80 mg via INTRAVENOUS

## 2022-08-12 MED ORDER — PROPOFOL 10 MG/ML IV BOLUS
INTRAVENOUS | Status: DC | PRN
  Administered 2022-08-12: 160 mg via INTRAVENOUS

## 2022-08-12 MED ORDER — OXYCODONE HCL 5 MG PO TABS
5.0000 mg | ORAL_TABLET | Freq: Once | ORAL | Status: AC
  Administered 2022-08-12: 5 mg via ORAL

## 2022-08-12 MED ORDER — ENSURE PRE-SURGERY PO LIQD: 296.0000 mL | Freq: Once | ORAL | Status: DC

## 2022-08-12 MED ORDER — CEFAZOLIN SODIUM-DEXTROSE 2-4 GM/100ML-% IV SOLN
2.0000 g | INTRAVENOUS | Status: AC
  Administered 2022-08-12: 2 g via INTRAVENOUS

## 2022-08-12 MED ORDER — FENTANYL CITRATE (PF) 100 MCG/2ML IJ SOLN
INTRAMUSCULAR | Status: AC
  Filled 2022-08-12: qty 2

## 2022-08-12 MED ORDER — ONDANSETRON HCL 4 MG/2ML IJ SOLN
INTRAMUSCULAR | Status: AC
  Filled 2022-08-12: qty 2

## 2022-08-12 MED ORDER — ACETAMINOPHEN 500 MG PO TABS
ORAL_TABLET | ORAL | Status: AC
  Filled 2022-08-12: qty 2

## 2022-08-12 MED ORDER — CELECOXIB 200 MG PO CAPS
400.0000 mg | ORAL_CAPSULE | ORAL | Status: AC
  Administered 2022-08-12: 400 mg via ORAL

## 2022-08-12 MED ORDER — LIDOCAINE 2% (20 MG/ML) 5 ML SYRINGE
INTRAMUSCULAR | Status: AC
  Filled 2022-08-12: qty 5

## 2022-08-12 MED ORDER — DEXAMETHASONE SODIUM PHOSPHATE 10 MG/ML IJ SOLN
INTRAMUSCULAR | Status: DC | PRN
  Administered 2022-08-12: 10 mg via INTRAVENOUS

## 2022-08-12 MED ORDER — BUPIVACAINE HCL (PF) 0.25 % IJ SOLN
INTRAMUSCULAR | Status: DC | PRN
  Administered 2022-08-12: 10 mL

## 2022-08-12 SURGICAL SUPPLY — 39 items
ADH SKN CLS APL DERMABOND .7 (GAUZE/BANDAGES/DRESSINGS) ×1
APL PRP STRL LF DISP 70% ISPRP (MISCELLANEOUS) ×1
BINDER BREAST LRG (GAUZE/BANDAGES/DRESSINGS) IMPLANT
BINDER BREAST XLRG (GAUZE/BANDAGES/DRESSINGS) IMPLANT
BLADE SURG 15 STRL LF DISP TIS (BLADE) ×1 IMPLANT
BLADE SURG 15 STRL SS (BLADE) ×1
CHLORAPREP W/TINT 26 (MISCELLANEOUS) ×1 IMPLANT
COVER BACK TABLE 60X90IN (DRAPES) ×1 IMPLANT
COVER MAYO STAND STRL (DRAPES) ×1 IMPLANT
COVER PROBE W GEL 5X96 (DRAPES) ×1 IMPLANT
DERMABOND ADVANCED (GAUZE/BANDAGES/DRESSINGS) ×1
DERMABOND ADVANCED .7 DNX12 (GAUZE/BANDAGES/DRESSINGS) ×1 IMPLANT
DRAPE LAPAROSCOPIC ABDOMINAL (DRAPES) ×1 IMPLANT
DRAPE UTILITY XL STRL (DRAPES) ×1 IMPLANT
DRSG TEGADERM 4X4.75 (GAUZE/BANDAGES/DRESSINGS) IMPLANT
ELECT COATED BLADE 2.86 ST (ELECTRODE) ×1 IMPLANT
ELECT REM PT RETURN 9FT ADLT (ELECTROSURGICAL) ×1
ELECTRODE REM PT RTRN 9FT ADLT (ELECTROSURGICAL) ×1 IMPLANT
GLOVE BIO SURGEON STRL SZ7 (GLOVE) ×2 IMPLANT
GLOVE BIOGEL PI IND STRL 7.5 (GLOVE) ×1 IMPLANT
GLOVE BIOGEL PI INDICATOR 7.5 (GLOVE) ×1
GOWN STRL REUS W/ TWL LRG LVL3 (GOWN DISPOSABLE) ×2 IMPLANT
GOWN STRL REUS W/TWL LRG LVL3 (GOWN DISPOSABLE) ×2
KIT MARKER MARGIN INK (KITS) ×1 IMPLANT
NDL HYPO 25X1 1.5 SAFETY (NEEDLE) ×1 IMPLANT
NEEDLE HYPO 25X1 1.5 SAFETY (NEEDLE) ×1 IMPLANT
PACK BASIN DAY SURGERY FS (CUSTOM PROCEDURE TRAY) ×1 IMPLANT
PENCIL SMOKE EVACUATOR (MISCELLANEOUS) ×1 IMPLANT
SLEEVE SCD COMPRESS KNEE MED (STOCKING) ×1 IMPLANT
SPONGE T-LAP 4X18 ~~LOC~~+RFID (SPONGE) ×1 IMPLANT
STRIP CLOSURE SKIN 1/2X4 (GAUZE/BANDAGES/DRESSINGS) ×1 IMPLANT
SUT MNCRL AB 4-0 PS2 18 (SUTURE) IMPLANT
SUT VIC AB 2-0 SH 27 (SUTURE) ×1
SUT VIC AB 2-0 SH 27XBRD (SUTURE) ×1 IMPLANT
SUT VIC AB 3-0 SH 27 (SUTURE) ×1
SUT VIC AB 3-0 SH 27X BRD (SUTURE) ×1 IMPLANT
SYR CONTROL 10ML LL (SYRINGE) ×1 IMPLANT
TOWEL GREEN STERILE FF (TOWEL DISPOSABLE) ×1 IMPLANT
TRAY FAXITRON CT DISP (TRAY / TRAY PROCEDURE) ×1 IMPLANT

## 2022-08-12 NOTE — Anesthesia Preprocedure Evaluation (Signed)
Anesthesia Evaluation  Patient identified by MRN, date of birth, ID band Patient awake    Reviewed: Allergy & Precautions, NPO status , Patient's Chart, lab work & pertinent test results  Airway Mallampati: II  TM Distance: >3 FB Neck ROM: Full    Dental no notable dental hx.    Pulmonary neg pulmonary ROS,    Pulmonary exam normal        Cardiovascular negative cardio ROS   Rhythm:Regular Rate:Normal     Neuro/Psych Anxiety Depression negative neurological ROS     GI/Hepatic negative GI ROS, Neg liver ROS,   Endo/Other  negative endocrine ROS  Renal/GU negative Renal ROS  negative genitourinary   Musculoskeletal Left breast mass   Abdominal Normal abdominal exam  (+)   Peds  Hematology  (+) Blood dyscrasia, anemia ,   Anesthesia Other Findings   Reproductive/Obstetrics                             Anesthesia Physical Anesthesia Plan  ASA: 2  Anesthesia Plan: General   Post-op Pain Management:    Induction: Intravenous  PONV Risk Score and Plan: 3 and Ondansetron, Dexamethasone, Midazolam and Treatment may vary due to age or medical condition  Airway Management Planned: Mask and LMA  Additional Equipment: None  Intra-op Plan:   Post-operative Plan: Extubation in OR  Informed Consent: I have reviewed the patients History and Physical, chart, labs and discussed the procedure including the risks, benefits and alternatives for the proposed anesthesia with the patient or authorized representative who has indicated his/her understanding and acceptance.     Dental advisory given  Plan Discussed with: CRNA  Anesthesia Plan Comments:         Anesthesia Quick Evaluation

## 2022-08-12 NOTE — Interval H&P Note (Signed)
History and Physical Interval Note:  08/12/2022 8:04 AM  Shelby Ramirez  has presented today for surgery, with the diagnosis of LEFT BREAST MASS.  The various methods of treatment have been discussed with the patient and family. After consideration of risks, benefits and other options for treatment, the patient has consented to  Procedure(s): LEFT BREAST SEED GUIDED EXCISIONAL BIOPSY (Left) as a surgical intervention.  The patient's history has been reviewed, patient examined, no change in status, stable for surgery.  I have reviewed the patient's chart and labs.  Questions were answered to the patient's satisfaction.     [name]

## 2022-08-12 NOTE — Discharge Instructions (Addendum)
May have tylenol after 12noon May have ibuprofen after 2pm    Post Anesthesia Home Care Instructions  Activity: Get plenty of rest for the remainder of the day. A responsible individual must stay with you for 24 hours following the procedure.  For the next 24 hours, DO NOT: -Drive a car -Advertising copywriter -Drink alcoholic beverages -Take any medication unless instructed by your physician -Make any legal decisions or sign important papers.  Meals: Start with liquid foods such as gelatin or soup. Progress to regular foods as tolerated. Avoid greasy, spicy, heavy foods. If nausea and/or vomiting occur, drink only clear liquids until the nausea and/or vomiting subsides. Call your physician if vomiting continues.  Special Instructions/Symptoms: Your throat may feel dry or sore from the anesthesia or the breathing tube placed in your throat during surgery. If this causes discomfort, gargle with warm salt water. The discomfort should disappear within 24 hours.  If you had a scopolamine patch placed behind your ear for the management of post- operative nausea and/or vomiting:  1. The medication in the patch is effective for 72 hours, after which it should be removed.  Wrap patch in a tissue and discard in the trash. Wash hands thoroughly with soap and water. 2. You may remove the patch earlier than 72 hours if you experience unpleasant side effects which may include dry mouth, dizziness or visual disturbances. 3. Avoid touching the patch. Wash your hands with soap and water after contact with the patch.    [facility] Office Phone Number [phone]  POST OP INSTRUCTIONS Take 400 mg of ibuprofen every 8 hours or 650 mg tylenol every 6 hours for next 72 hours then as needed. Use ice several times daily also.  A prescription for pain medication may be given to you upon discharge.  Take your pain medication as prescribed, if needed.  If narcotic pain medicine is not needed,  then you may take acetaminophen (Tylenol), naprosyn (Alleve) or ibuprofen (Advil) as needed. Take your usually prescribed medications unless otherwise directed If you need a refill on your pain medication, please contact your pharmacy.  They will contact our office to request authorization.  Prescriptions will not be filled after 5pm or on week-ends. You should eat very light the first 24 hours after surgery, such as soup, crackers, pudding, etc.  Resume your normal diet the day after surgery. Most patients will experience some swelling and bruising in the breast.  Ice packs and a good support bra will help.  Wear the breast binder provided or a sports bra for 72 hours day and night.  After that wear a sports bra during the day until you return to the office. Swelling and bruising can take several days to resolve.  It is common to experience some constipation if taking pain medication after surgery.  Increasing fluid intake and taking a stool softener will usually help or prevent this problem from occurring.  A mild laxative (Milk of Magnesia or Miralax) should be taken according to package directions if there are no bowel movements after 48 hours. I used skin glue on the incision, you may shower in 24 hours.  The glue will flake off over the next 2-3 weeks.  Any sutures or staples will be removed at the office during your follow-up visit. ACTIVITIES:  You may resume regular daily activities (gradually increasing) beginning the next day.  Wearing a good support bra or sports bra minimizes pain and swelling.  You may have sexual intercourse when  it is comfortable. You may drive when you no longer are taking prescription pain medication, you can comfortably wear a seatbelt, and you can safely maneuver your car and apply brakes. RETURN TO WORK:  ______________________________________________________________________________________ Bonita Quin should see your doctor in the office for a follow-up appointment  approximately two weeks after your surgery.  Your doctor's nurse will typically make your follow-up appointment when she calls you with your pathology report.  Expect your pathology report 3-4 business days after your surgery.  You may call to check if you do not hear from [place] after three days. OTHER INSTRUCTIONS: _______________________________________________________________________________________________ _____________________________________________________________________________________________________________________________________ _____________________________________________________________________________________________________________________________________ _____________________________________________________________________________________________________________________________________  WHEN TO CALL DR WAKEFIELD: Fever over 101.0 Nausea and/or vomiting. Extreme swelling or bruising. Continued bleeding from incision. Increased pain, redness, or drainage from the incision.  The clinic staff is available to answer your questions during regular business hours.  Please don't hesitate to call and ask to speak to one of the nurses for clinical concerns.  If you have a medical emergency, go to the nearest emergency room or call 911.  A surgeon from [facility] is always on call at the hospital.  For further questions, please visit [email or web address]

## 2022-08-12 NOTE — H&P (Signed)
  61 year old female has no real prior breast history presented for screening mammogram. She had no discharge and no mass. She does have a family history in her mom who was diagnosed with breast cancer at 66 and was passed away at age 74. She does have a history of retromuscular saline implants in 2000. She had a mammogram and ultrasound that shows B density breast. Mammogram show a small mass in the lateral left breast. Targeted ultrasound shows a mass in the 3:00 location 9 cm from the nipple that measures 7 x 3 x 5 mm. There is no axillary adenopathy. Biopsy of this is done and shows fibrocystic changes with apocrine metaplasia and an incidental micro papilloma. She is here to discuss options now.  Review of Systems: A complete review of systems was obtained from the patient. I have reviewed this information and discussed as appropriate with the patient. See HPI as well for other ROS.  Review of Systems  Musculoskeletal: Positive for neck pain.  Psychiatric/Behavioral: Positive for depression. The patient is nervous/anxious.  All other systems reviewed and are negative.   Medical History: Past Medical History:  Diagnosis Date  Anxiety   Past Surgical History:  Procedure Laterality Date  ABDOMINOPLASTY 2017  COMBINED AUGMENTATION MAMMAPLASTY AND ABDOMINOPLASTY    Allergies  Allergen Reactions  Metronidazole Rash and Unknown   Current Outpatient Medications on File Prior to Visit  Medication Sig Dispense Refill  progesterone (PROMETRIUM) 200 MG capsule 1 PO HS first 14 days every other month starting 05/13/20  venlafaxine (EFFEXOR-XR) 37.5 MG XR capsule TAKE 1 CAPSULE BY MOUTH EVERY DAY FOR 7 DAYS THAN 2 CAPS (75MG ) EVERY DAY THEREAFTER  DOTTI 0.05 mg/24 hr semiwkly patch Place 1 patch onto the skin once or twice a week.   Family History  Problem Relation Age of Onset  Breast cancer Mother  Skin cancer Father  High blood pressure (Hypertension) Maternal Grandmother  Stroke Maternal  Grandfather    Social History   Tobacco Use  Smoking Status Never  Smokeless Tobacco Not on file  Marital status: Divorced  Tobacco Use  Smoking status: Never  Substance and Sexual Activity  Alcohol use: Yes  Drug use: Never   Objective:   Vitals:  07/14/22 0949  BP: 138/86  Pulse: 98  Temp: 37.1 C (98.7 F)  SpO2: 97%  Weight: 80.8 kg (178 lb 3.2 oz)  Height: 172.7 cm (5\' 8" )   Body mass index is 27.1 kg/m.  Physical Exam Vitals reviewed.  Constitutional:  Appearance: Normal appearance.  Chest:  Breasts: Right: No inverted nipple, mass or nipple discharge.  Left: No inverted nipple, mass or nipple discharge.  Comments: Bilateral implants Lymphadenopathy:  Upper Body:  Right upper body: No supraclavicular or axillary adenopathy.  Left upper body: No supraclavicular or axillary adenopathy.  Neurological:  Mental Status: She is alert.    Assessment and Plan:   Papilloma of left breast  Left breast radioactive seed guided excisional biopsy  We discussed options including observation. I think with her family history it is very reasonable to consider excision. We discussed seed guided excisional biopsy and the risks and benefits as well as the recovery. We also discussed genetic testing and we are going to await the results of the surgery and we will discuss that on her follow-up.

## 2022-08-12 NOTE — Anesthesia Procedure Notes (Signed)
Procedure Name: LMA Insertion Date/Time: 08/12/2022 8:25 AM  Performed by: Pearson Grippe, CRNAPre-anesthesia Checklist: Patient identified, Emergency Drugs available, Suction available and Patient being monitored Patient Re-evaluated:Patient Re-evaluated prior to induction Oxygen Delivery Method: Circle system utilized Preoxygenation: Pre-oxygenation with 100% oxygen Induction Type: IV induction Ventilation: Mask ventilation without difficulty LMA: LMA inserted LMA Size: 4.0 Number of attempts: 1 Airway Equipment and Method: Bite block Placement Confirmation: positive ETCO2 Tube secured with: Tape Dental Injury: Teeth and Oropharynx as per pre-operative assessment

## 2022-08-12 NOTE — Anesthesia Postprocedure Evaluation (Signed)
Anesthesia Post Note  Patient: Shelby Ramirez  Procedure(s) Performed: LEFT BREAST SEED GUIDED EXCISIONAL BIOPSY (Left: Breast)     Patient location during evaluation: PACU Anesthesia Type: General Level of consciousness: awake and alert Pain management: pain level controlled Vital Signs Assessment: post-procedure vital signs reviewed and stable Respiratory status: spontaneous breathing, nonlabored ventilation, respiratory function stable and patient connected to nasal cannula oxygen Cardiovascular status: blood pressure returned to baseline and stable Postop Assessment: no apparent nausea or vomiting Anesthetic complications: no   No notable events documented.  Last Vitals:  Vitals:   08/12/22 0930 08/12/22 0946  BP: 106/64 110/74  Pulse: (!) 59 65  Resp: 13 14  Temp:  36.6 C  SpO2: 93% 93%    Last Pain:  Vitals:   08/12/22 0946  TempSrc:   PainSc: 4                  Tonnie Friedel P Yuvin Bussiere

## 2022-08-12 NOTE — Transfer of Care (Signed)
Immediate Anesthesia Transfer of Care Note  Patient: Shelby Ramirez  Procedure(s) Performed: LEFT BREAST SEED GUIDED EXCISIONAL BIOPSY (Left: Breast)  Patient Location: PACU  Anesthesia Type:General  Level of Consciousness: drowsy and patient cooperative  Airway & Oxygen Therapy: Patient Spontanous Breathing and Patient connected to face mask oxygen  Post-op Assessment: Report given to RN and Post -op Vital signs reviewed and stable  Post vital signs: Reviewed and stable  Last Vitals:  Vitals Value Taken Time  BP 98/62   Temp    Pulse 60 08/12/22 0857  Resp 14 08/12/22 0857  SpO2 97 % 08/12/22 0857  Vitals shown include unvalidated device data.  Last Pain:  Vitals:   08/12/22 0744  TempSrc: Oral  PainSc: 0-No pain      Patients Stated Pain Goal: 7 (08/12/22 0744)  Complications: No notable events documented.

## 2022-08-12 NOTE — Op Note (Signed)
Preoperative diagnosis: left breast mass Postoperative diagnosis: Same as above Procedure  Left breast seed guided excisional biopsy Surgeon: Dr. Harden Mo Anesthesia: General  Estimated blood loss: minimal Specimens: Left breast tissue containing seed and clip marked with paint Complications: None Drains: None Special count was correct completion Disposition to recovery in stable condition  Indications: 61 year old female has no real prior breast history presented for screening mammogram.. Mammogram show a small mass in the lateral left breast. Targeted ultrasound shows a mass in the 3:00 location 9 cm from the nipple that measures 7 x 3 x 5 mm.  Biopsy of this is done and shows fibrocystic changes with apocrine metaplasia and an incidental micro papilloma. We discussed options and she elected for excisional biopsy.  Procedure: After informed consent was obtained the patient was taken to the OR.  She was given antibiotics.  SCDs were in place.  She was placed under general anesthesia without complication.  She was prepped and draped in the standard sterile surgical fashion.  Surgical timeout was then performed.   I identified the seed in the lower outer quadrant of the  breast.  It was very close to the skin so I infiltrated marcaine and made a curvilinear incision overlying the seed.  I then dissected to the seed. I removed the seed and some of surrounding tissue. Mammogram confirmed removal of the seed and the clip.   Hemostasis was observed. The implant capsule was not disturbed. I closed the breast tissue with 2-0 vicryl.I then closed skin with 3-0 vicryl and 4-0 monocryl. Glue was placed.   she tolerated well, was extubated and transferred to recovery stable

## 2022-08-13 ENCOUNTER — Encounter (HOSPITAL_BASED_OUTPATIENT_CLINIC_OR_DEPARTMENT_OTHER): Payer: Self-pay | Admitting: General Surgery

## 2022-08-13 LAB — SURGICAL PATHOLOGY

## 2022-08-18 ENCOUNTER — Encounter (HOSPITAL_COMMUNITY): Payer: Self-pay

## 2022-08-18 IMAGING — US US BREAST BX W LOC DEV 1ST LESION IMG BX SPEC US GUIDE*L*
1 series · 12 of 12 positions shown · non-contrast
Comparison: None Available.
COMPARISON: None Available.

Addendum:
CLINICAL DATA: 60-year-old female for tissue sampling of 0.7 cm
OUTER LEFT breast mass.

EXAM:
ULTRASOUND GUIDED LEFT BREAST CORE NEEDLE BIOPSY

[Series 1: us breast bx w loc dev 1st lesion img bx spec us g · 0.04mm/px · 12 of 12 slices shown]
[im 1/12]
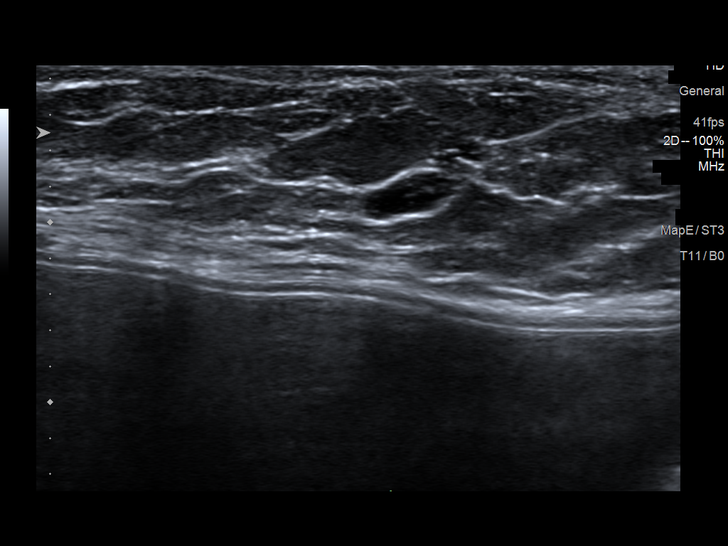
[im 2/12]
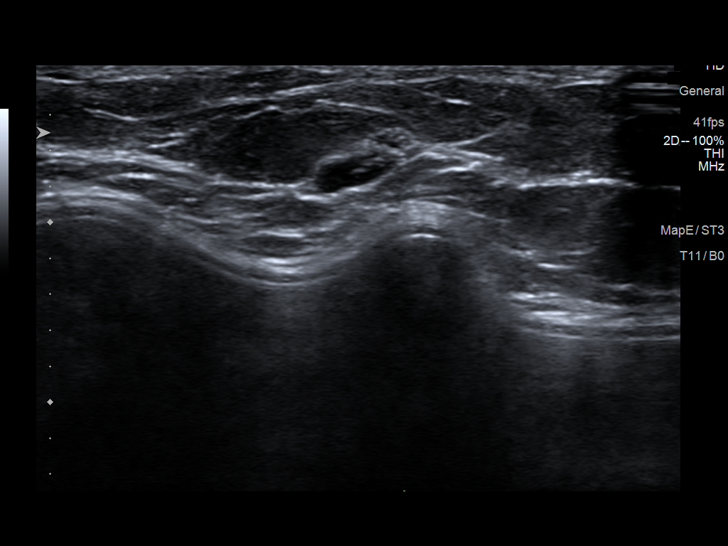
[im 3/12]
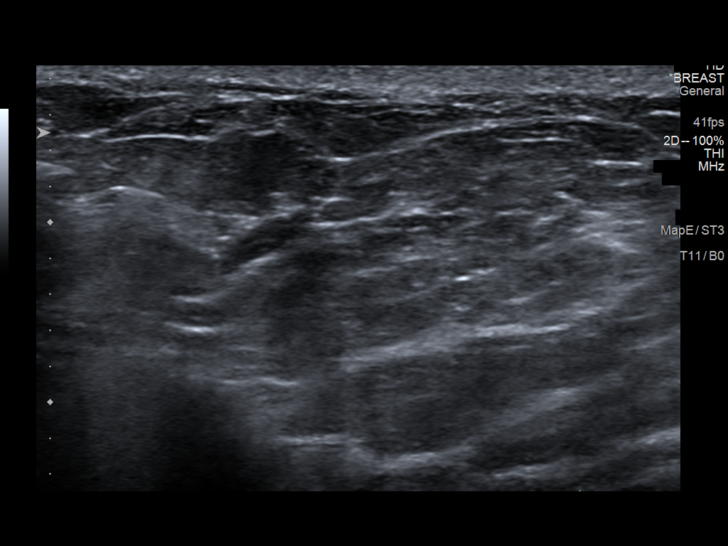
[im 4/12]
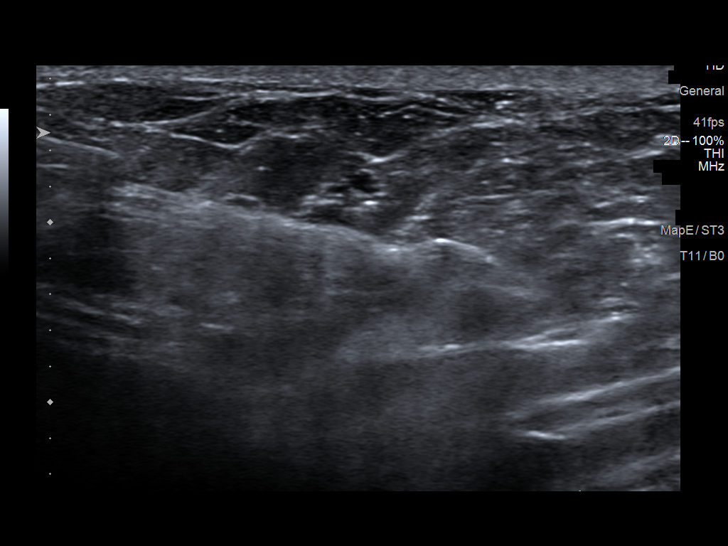
[im 5/12]
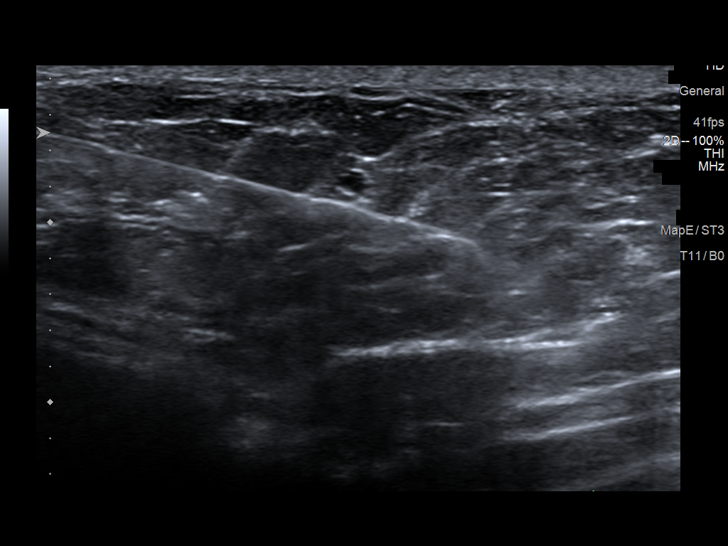
[im 6/12]
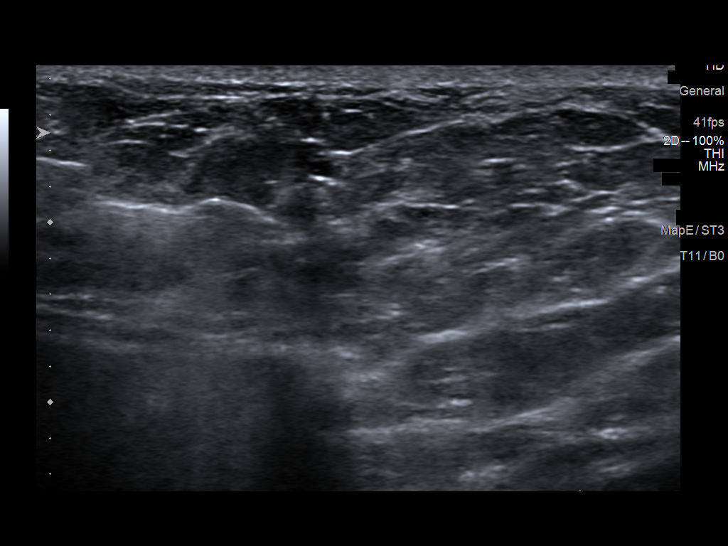
[im 7/12]
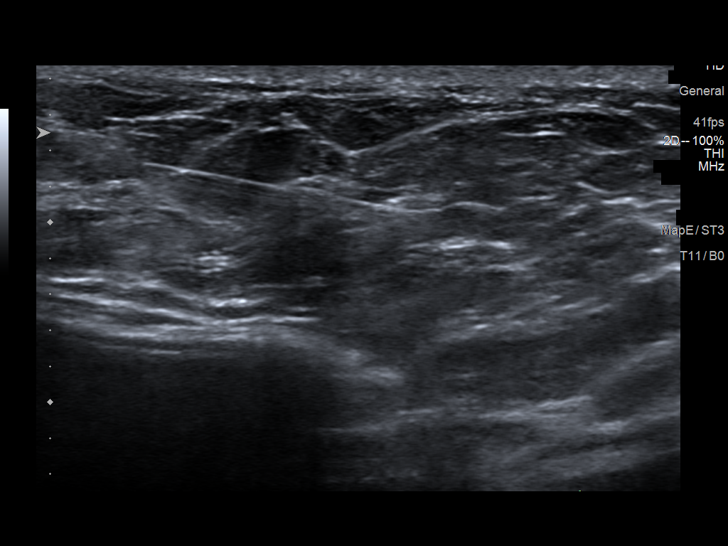
[im 8/12]
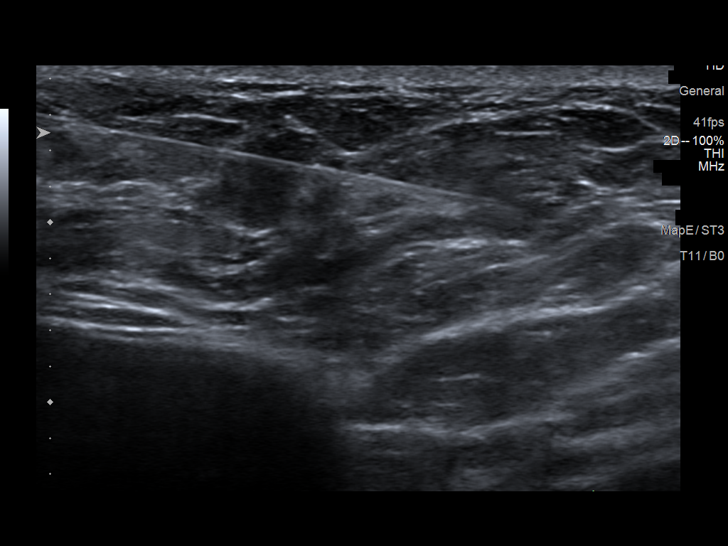
[im 9/12]
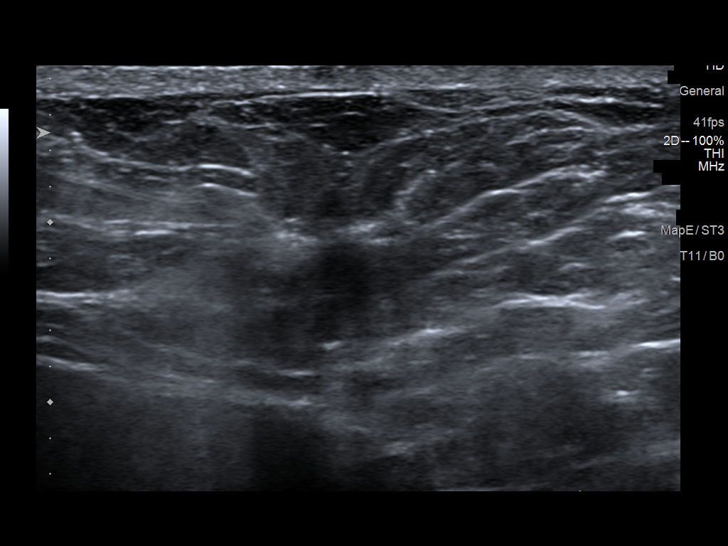
[im 10/12]
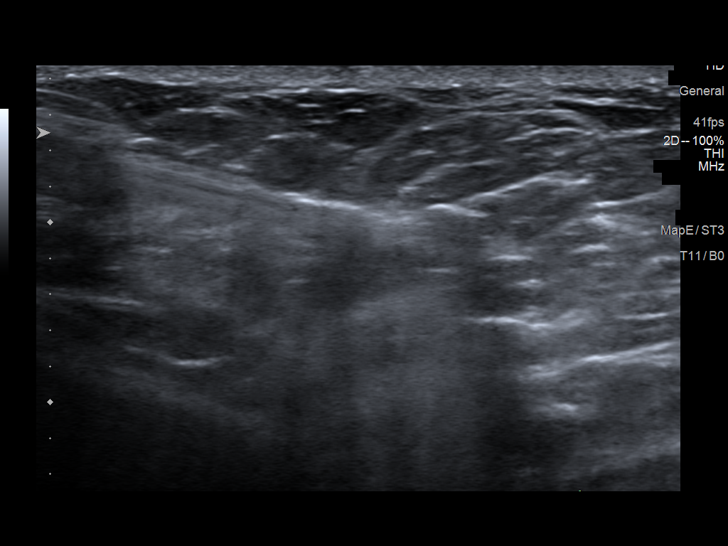
[im 11/12]
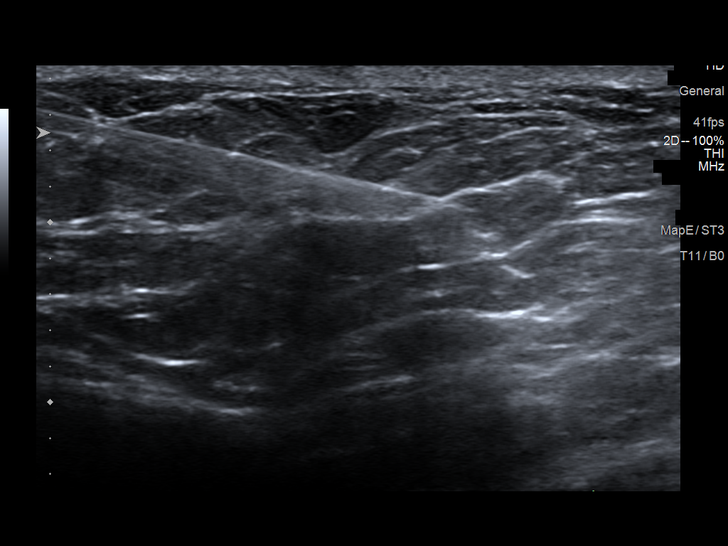
[im 12/12]
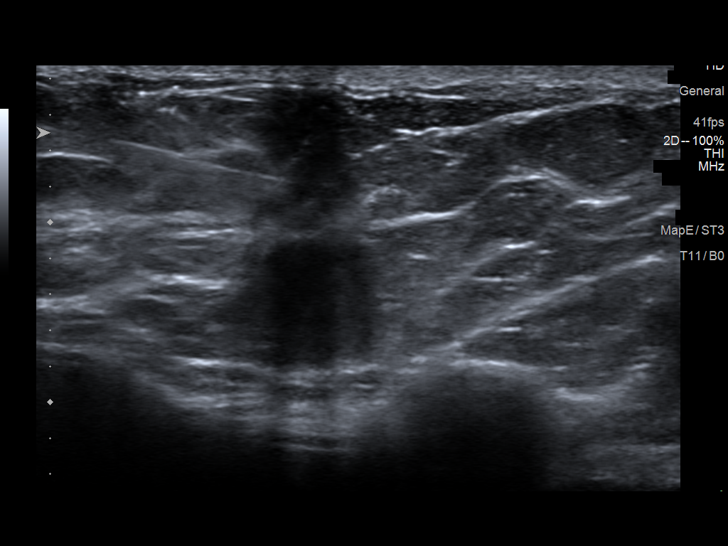

[12 of 12 positions shown; findings below may reference images not displayed]

PROCEDURE:
I met with the patient and we discussed the procedure of
ultrasound-guided biopsy, including benefits and alternatives. We
discussed the high likelihood of a successful procedure. We
discussed the risks of the procedure, including implant damage
infection, bleeding, tissue injury, clip migration, and inadequate
sampling. Informed written consent was given. The usual time-out
protocol was performed immediately prior to the procedure.

Using sterile technique and 1% Lidocaine as local anesthetic, under
direct ultrasound visualization, a 14 gauge Idk device was
used to perform biopsy of the 0.7 cm mass at the 3 o'clock position
of the LEFT breast 9 cm from the nipple using a superomedial
approach. At the conclusion of the procedure a HydroMARK shaped
tissue marker clip was deployed into the biopsy cavity. Follow up 2
view mammogram was performed and dictated separately.
IMPRESSION: Ultrasound guided biopsy of 0.7 cm OUTER LEFT breast mass. No
apparent complications.

ADDENDUM:
Pathology revealed FIBROCYSTIC CHANGES WITH APOCRINE METAPLASIA
INCIDENTAL MICROPAPILLOMA of the LEFT breast, 3 o'clock, 9 cm from
the nipple, (hydromark clip). This was found to be concordant by Dr.
Ferienhaus Erxleben.

The patient reported doing well after the biopsy with tenderness at
the site. Post biopsy instructions and care were reviewed, and
questions were answered. The patient was encouraged to call The
direct phone number was provided.

The patient was given the option of having a surgical consultation
or to return for LEFT diagnostic mammogram and ultrasound in 6
months, and subsequent imaging surveillance for 2 years, per
[HOSPITAL] Breast Working Group protocol.

The patient stated she would do research and contact me if a
surgical referral is desired. My direct phone number was provided.

Pathology results reported by Lorenz Jumper, RN on 05/11/2022.

*** End of Addendum ***
PROCEDURE:
I met with the patient and we discussed the procedure of
ultrasound-guided biopsy, including benefits and alternatives. We
discussed the high likelihood of a successful procedure. We
discussed the risks of the procedure, including implant damage
infection, bleeding, tissue injury, clip migration, and inadequate
sampling. Informed written consent was given. The usual time-out
protocol was performed immediately prior to the procedure.

Using sterile technique and 1% Lidocaine as local anesthetic, under
direct ultrasound visualization, a 14 gauge Idk device was
used to perform biopsy of the 0.7 cm mass at the 3 o'clock position
of the LEFT breast 9 cm from the nipple using a superomedial
approach. At the conclusion of the procedure a HydroMARK shaped
tissue marker clip was deployed into the biopsy cavity. Follow up 2
view mammogram was performed and dictated separately.
IMPRESSION: Ultrasound guided biopsy of 0.7 cm OUTER LEFT breast mass. No
apparent complications.

## 2022-08-18 IMAGING — MG MM BREAST LOCALIZATION CLIP
8 series · 8 of 20 positions shown · non-contrast
Comparison: Previous exam(s).

CLINICAL DATA: Evaluate HydroMARK spiral clip placement following
ultrasound-guided LEFT breast biopsy.

EXAM:
3D DIAGNOSTIC LEFT MAMMOGRAM POST ULTRASOUND BIOPSY

[L CC]
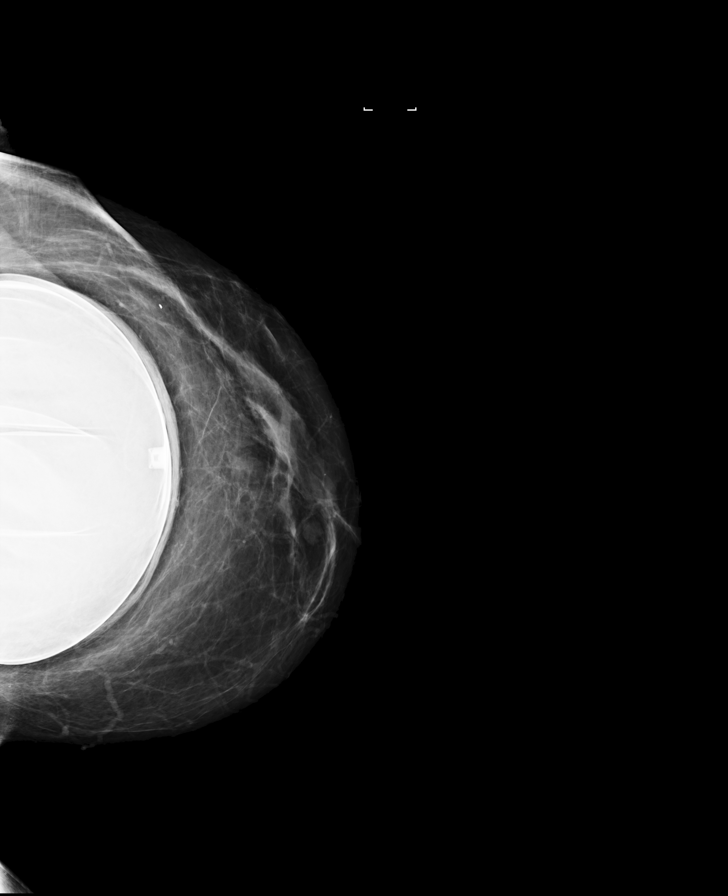

[L ML]
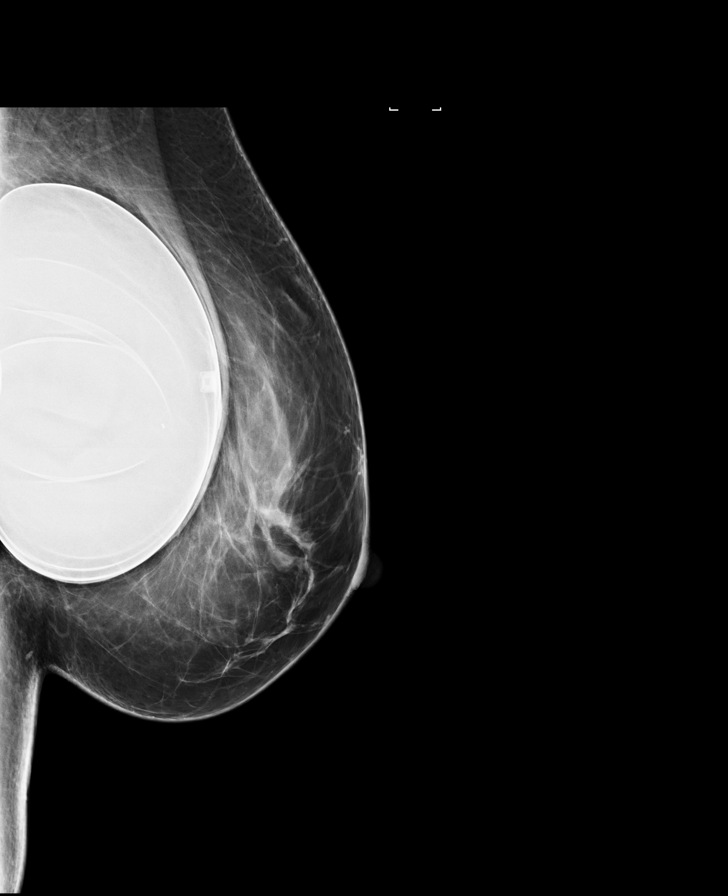

[L CC synth-2D]
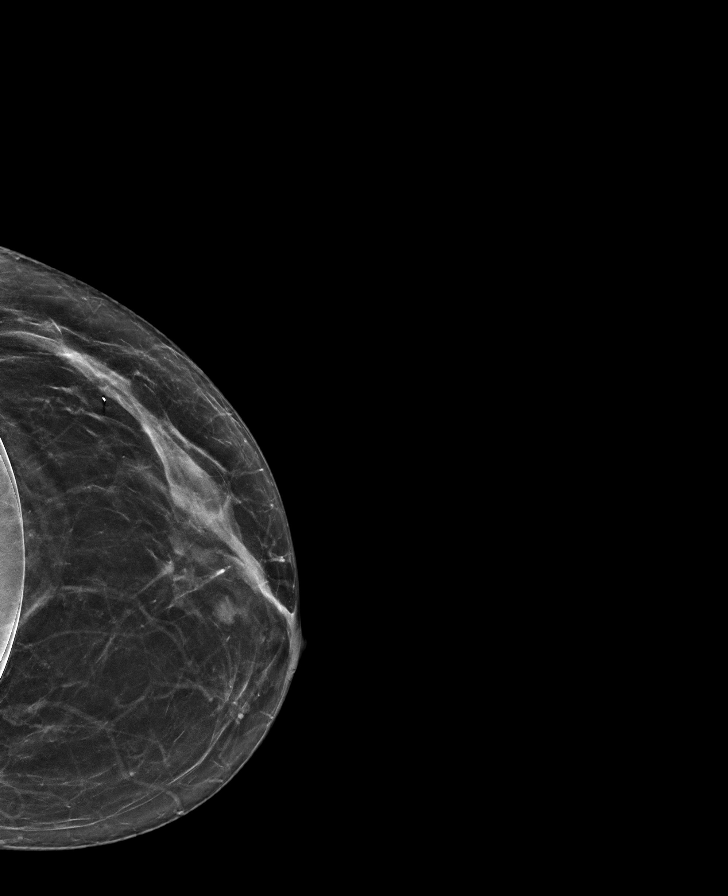

[L ML synth-2D (1 of 2)]
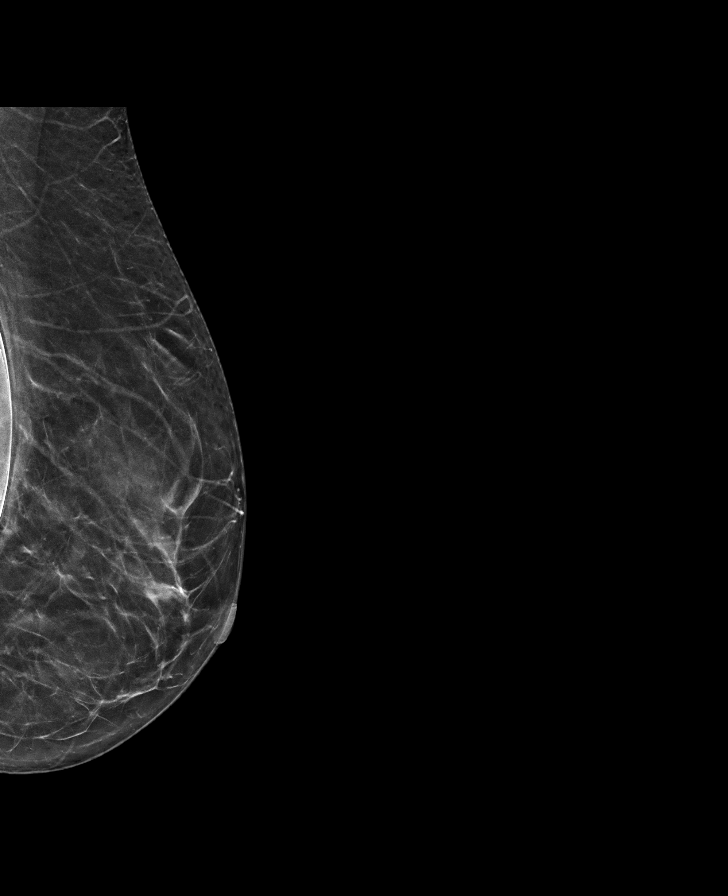

[L ML synth-2D (2 of 2)]
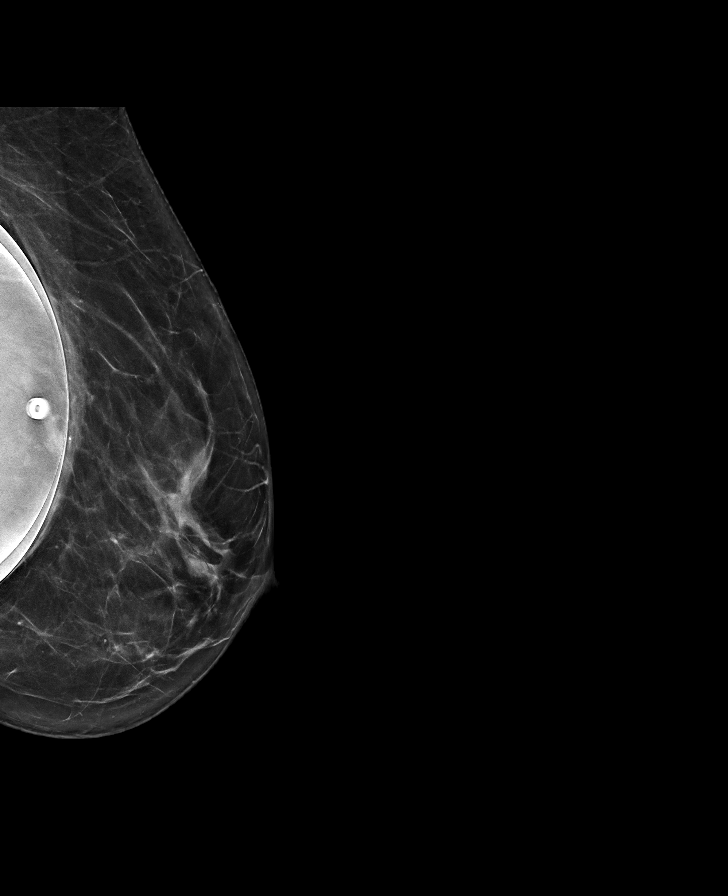

[L MLID BREAST TOMOSYNTHESIS IMAGE tomo (1 of 2) · tomo slice 37/74.0]
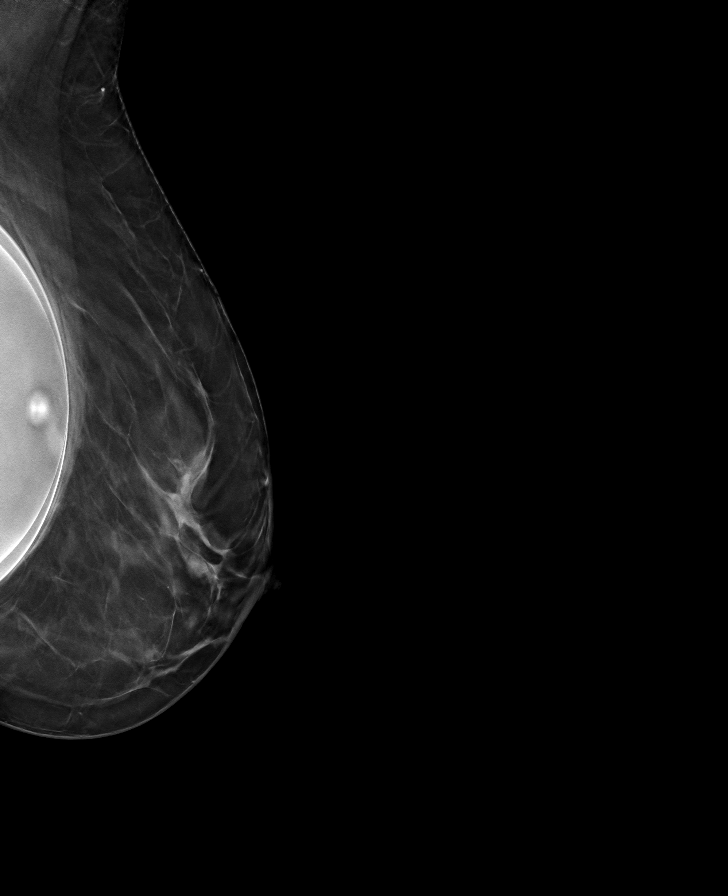

[L CCID BREAST TOMOSYNTHESIS IMAGE tomo · tomo slice 31/62.0]
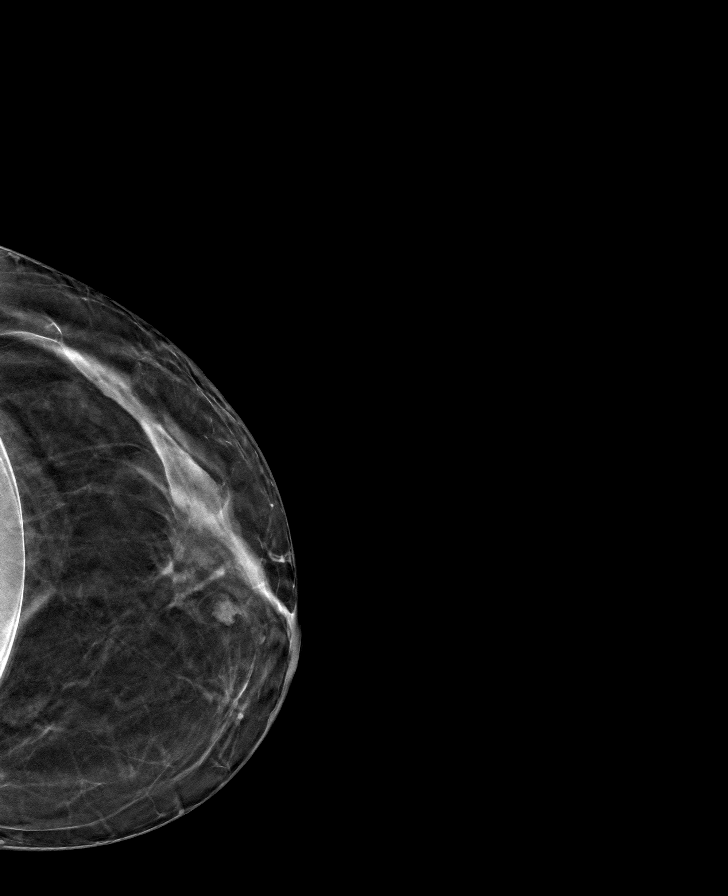

[L MLID BREAST TOMOSYNTHESIS IMAGE tomo (2 of 2) · tomo slice 31/61.0]
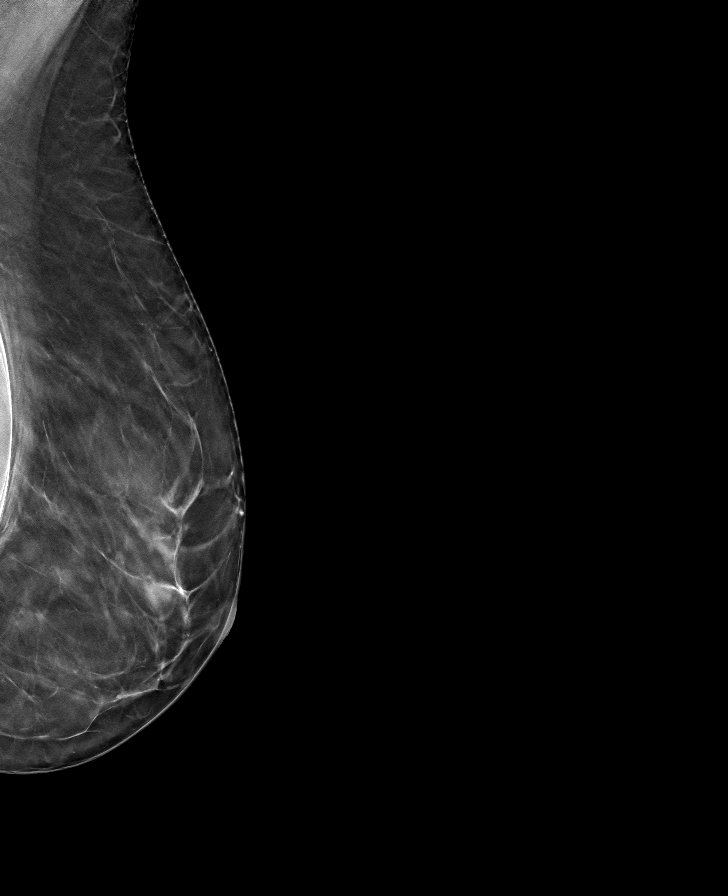

[8 of 20 positions shown; findings below may reference images not displayed]

FINDINGS: 3D Mammographic images were obtained following ultrasound guided
biopsy of the 0.7 cm mass at the 3 o'clock position of the LEFT
breast. The HydroMARK biopsy marking clip is in expected position at
the site of biopsy.
IMPRESSION: Appropriate positioning of the HydroMARK spiral shaped biopsy
marking clip at the site of biopsy in the OUTER LEFT breast.

Final Assessment: Post Procedure Mammograms for Marker Placement

## 2022-08-24 DIAGNOSIS — M9902 Segmental and somatic dysfunction of thoracic region: Secondary | ICD-10-CM | POA: Diagnosis not present

## 2022-08-24 DIAGNOSIS — M9901 Segmental and somatic dysfunction of cervical region: Secondary | ICD-10-CM | POA: Diagnosis not present

## 2022-08-24 DIAGNOSIS — M9903 Segmental and somatic dysfunction of lumbar region: Secondary | ICD-10-CM | POA: Diagnosis not present

## 2022-08-24 DIAGNOSIS — M9904 Segmental and somatic dysfunction of sacral region: Secondary | ICD-10-CM | POA: Diagnosis not present

## 2022-08-31 DIAGNOSIS — M9903 Segmental and somatic dysfunction of lumbar region: Secondary | ICD-10-CM | POA: Diagnosis not present

## 2022-08-31 DIAGNOSIS — R300 Dysuria: Secondary | ICD-10-CM | POA: Diagnosis not present

## 2022-08-31 DIAGNOSIS — M9901 Segmental and somatic dysfunction of cervical region: Secondary | ICD-10-CM | POA: Diagnosis not present

## 2022-08-31 DIAGNOSIS — M9904 Segmental and somatic dysfunction of sacral region: Secondary | ICD-10-CM | POA: Diagnosis not present

## 2022-08-31 DIAGNOSIS — M9902 Segmental and somatic dysfunction of thoracic region: Secondary | ICD-10-CM | POA: Diagnosis not present

## 2022-09-07 DIAGNOSIS — M9903 Segmental and somatic dysfunction of lumbar region: Secondary | ICD-10-CM | POA: Diagnosis not present

## 2022-09-07 DIAGNOSIS — M9904 Segmental and somatic dysfunction of sacral region: Secondary | ICD-10-CM | POA: Diagnosis not present

## 2022-09-07 DIAGNOSIS — M9901 Segmental and somatic dysfunction of cervical region: Secondary | ICD-10-CM | POA: Diagnosis not present

## 2022-09-07 DIAGNOSIS — M9902 Segmental and somatic dysfunction of thoracic region: Secondary | ICD-10-CM | POA: Diagnosis not present

## 2022-11-17 DIAGNOSIS — M9902 Segmental and somatic dysfunction of thoracic region: Secondary | ICD-10-CM | POA: Diagnosis not present

## 2022-11-17 DIAGNOSIS — M9903 Segmental and somatic dysfunction of lumbar region: Secondary | ICD-10-CM | POA: Diagnosis not present

## 2022-11-17 DIAGNOSIS — M9904 Segmental and somatic dysfunction of sacral region: Secondary | ICD-10-CM | POA: Diagnosis not present

## 2022-11-17 DIAGNOSIS — M9901 Segmental and somatic dysfunction of cervical region: Secondary | ICD-10-CM | POA: Diagnosis not present

## 2022-12-01 DIAGNOSIS — M9903 Segmental and somatic dysfunction of lumbar region: Secondary | ICD-10-CM | POA: Diagnosis not present

## 2022-12-01 DIAGNOSIS — M9904 Segmental and somatic dysfunction of sacral region: Secondary | ICD-10-CM | POA: Diagnosis not present

## 2022-12-01 DIAGNOSIS — M9902 Segmental and somatic dysfunction of thoracic region: Secondary | ICD-10-CM | POA: Diagnosis not present

## 2022-12-01 DIAGNOSIS — M9901 Segmental and somatic dysfunction of cervical region: Secondary | ICD-10-CM | POA: Diagnosis not present

## 2022-12-08 DIAGNOSIS — M9904 Segmental and somatic dysfunction of sacral region: Secondary | ICD-10-CM | POA: Diagnosis not present

## 2022-12-08 DIAGNOSIS — M9901 Segmental and somatic dysfunction of cervical region: Secondary | ICD-10-CM | POA: Diagnosis not present

## 2022-12-08 DIAGNOSIS — M9902 Segmental and somatic dysfunction of thoracic region: Secondary | ICD-10-CM | POA: Diagnosis not present

## 2022-12-08 DIAGNOSIS — M9903 Segmental and somatic dysfunction of lumbar region: Secondary | ICD-10-CM | POA: Diagnosis not present

## 2023-01-06 ENCOUNTER — Encounter: Payer: Self-pay | Admitting: Internal Medicine

## 2023-01-13 DIAGNOSIS — M9904 Segmental and somatic dysfunction of sacral region: Secondary | ICD-10-CM | POA: Diagnosis not present

## 2023-01-13 DIAGNOSIS — M9902 Segmental and somatic dysfunction of thoracic region: Secondary | ICD-10-CM | POA: Diagnosis not present

## 2023-01-13 DIAGNOSIS — M9901 Segmental and somatic dysfunction of cervical region: Secondary | ICD-10-CM | POA: Diagnosis not present

## 2023-01-13 DIAGNOSIS — M9903 Segmental and somatic dysfunction of lumbar region: Secondary | ICD-10-CM | POA: Diagnosis not present

## 2023-01-14 DIAGNOSIS — F902 Attention-deficit hyperactivity disorder, combined type: Secondary | ICD-10-CM | POA: Diagnosis not present

## 2023-01-14 DIAGNOSIS — F339 Major depressive disorder, recurrent, unspecified: Secondary | ICD-10-CM | POA: Diagnosis not present

## 2023-01-14 DIAGNOSIS — R69 Illness, unspecified: Secondary | ICD-10-CM | POA: Diagnosis not present

## 2023-01-20 DIAGNOSIS — M9902 Segmental and somatic dysfunction of thoracic region: Secondary | ICD-10-CM | POA: Diagnosis not present

## 2023-01-20 DIAGNOSIS — M9904 Segmental and somatic dysfunction of sacral region: Secondary | ICD-10-CM | POA: Diagnosis not present

## 2023-01-20 DIAGNOSIS — M9901 Segmental and somatic dysfunction of cervical region: Secondary | ICD-10-CM | POA: Diagnosis not present

## 2023-01-20 DIAGNOSIS — M9903 Segmental and somatic dysfunction of lumbar region: Secondary | ICD-10-CM | POA: Diagnosis not present

## 2023-01-21 DIAGNOSIS — F332 Major depressive disorder, recurrent severe without psychotic features: Secondary | ICD-10-CM | POA: Diagnosis not present

## 2023-01-21 DIAGNOSIS — R69 Illness, unspecified: Secondary | ICD-10-CM | POA: Diagnosis not present

## 2023-01-27 DIAGNOSIS — R69 Illness, unspecified: Secondary | ICD-10-CM | POA: Diagnosis not present

## 2023-01-27 DIAGNOSIS — F332 Major depressive disorder, recurrent severe without psychotic features: Secondary | ICD-10-CM | POA: Diagnosis not present

## 2023-02-03 DIAGNOSIS — M9902 Segmental and somatic dysfunction of thoracic region: Secondary | ICD-10-CM | POA: Diagnosis not present

## 2023-02-03 DIAGNOSIS — M9901 Segmental and somatic dysfunction of cervical region: Secondary | ICD-10-CM | POA: Diagnosis not present

## 2023-02-03 DIAGNOSIS — M9903 Segmental and somatic dysfunction of lumbar region: Secondary | ICD-10-CM | POA: Diagnosis not present

## 2023-02-03 DIAGNOSIS — M9904 Segmental and somatic dysfunction of sacral region: Secondary | ICD-10-CM | POA: Diagnosis not present

## 2023-02-08 DIAGNOSIS — F902 Attention-deficit hyperactivity disorder, combined type: Secondary | ICD-10-CM | POA: Diagnosis not present

## 2023-02-08 DIAGNOSIS — R69 Illness, unspecified: Secondary | ICD-10-CM | POA: Diagnosis not present

## 2023-02-23 DIAGNOSIS — M9903 Segmental and somatic dysfunction of lumbar region: Secondary | ICD-10-CM | POA: Diagnosis not present

## 2023-02-23 DIAGNOSIS — M9904 Segmental and somatic dysfunction of sacral region: Secondary | ICD-10-CM | POA: Diagnosis not present

## 2023-02-23 DIAGNOSIS — M9902 Segmental and somatic dysfunction of thoracic region: Secondary | ICD-10-CM | POA: Diagnosis not present

## 2023-02-23 DIAGNOSIS — M9901 Segmental and somatic dysfunction of cervical region: Secondary | ICD-10-CM | POA: Diagnosis not present

## 2023-03-16 DIAGNOSIS — M9901 Segmental and somatic dysfunction of cervical region: Secondary | ICD-10-CM | POA: Diagnosis not present

## 2023-03-16 DIAGNOSIS — M9902 Segmental and somatic dysfunction of thoracic region: Secondary | ICD-10-CM | POA: Diagnosis not present

## 2023-03-16 DIAGNOSIS — M9903 Segmental and somatic dysfunction of lumbar region: Secondary | ICD-10-CM | POA: Diagnosis not present

## 2023-03-16 DIAGNOSIS — M9904 Segmental and somatic dysfunction of sacral region: Secondary | ICD-10-CM | POA: Diagnosis not present

## 2023-03-18 DIAGNOSIS — F411 Generalized anxiety disorder: Secondary | ICD-10-CM | POA: Diagnosis not present

## 2023-03-18 DIAGNOSIS — F902 Attention-deficit hyperactivity disorder, combined type: Secondary | ICD-10-CM | POA: Diagnosis not present

## 2023-03-18 DIAGNOSIS — F339 Major depressive disorder, recurrent, unspecified: Secondary | ICD-10-CM | POA: Diagnosis not present

## 2023-03-30 DIAGNOSIS — M9901 Segmental and somatic dysfunction of cervical region: Secondary | ICD-10-CM | POA: Diagnosis not present

## 2023-03-30 DIAGNOSIS — M9904 Segmental and somatic dysfunction of sacral region: Secondary | ICD-10-CM | POA: Diagnosis not present

## 2023-03-30 DIAGNOSIS — M9903 Segmental and somatic dysfunction of lumbar region: Secondary | ICD-10-CM | POA: Diagnosis not present

## 2023-03-30 DIAGNOSIS — M9902 Segmental and somatic dysfunction of thoracic region: Secondary | ICD-10-CM | POA: Diagnosis not present

## 2023-04-06 DIAGNOSIS — M9903 Segmental and somatic dysfunction of lumbar region: Secondary | ICD-10-CM | POA: Diagnosis not present

## 2023-04-06 DIAGNOSIS — M9901 Segmental and somatic dysfunction of cervical region: Secondary | ICD-10-CM | POA: Diagnosis not present

## 2023-04-06 DIAGNOSIS — M9904 Segmental and somatic dysfunction of sacral region: Secondary | ICD-10-CM | POA: Diagnosis not present

## 2023-04-06 DIAGNOSIS — M9902 Segmental and somatic dysfunction of thoracic region: Secondary | ICD-10-CM | POA: Diagnosis not present

## 2023-04-12 DIAGNOSIS — Z1389 Encounter for screening for other disorder: Secondary | ICD-10-CM | POA: Diagnosis not present

## 2023-04-12 DIAGNOSIS — Z124 Encounter for screening for malignant neoplasm of cervix: Secondary | ICD-10-CM | POA: Diagnosis not present

## 2023-04-12 DIAGNOSIS — Z1151 Encounter for screening for human papillomavirus (HPV): Secondary | ICD-10-CM | POA: Diagnosis not present

## 2023-04-12 DIAGNOSIS — Z780 Asymptomatic menopausal state: Secondary | ICD-10-CM | POA: Diagnosis not present

## 2023-04-12 DIAGNOSIS — Z01419 Encounter for gynecological examination (general) (routine) without abnormal findings: Secondary | ICD-10-CM | POA: Diagnosis not present

## 2023-04-12 DIAGNOSIS — Z79890 Hormone replacement therapy (postmenopausal): Secondary | ICD-10-CM | POA: Diagnosis not present

## 2023-04-12 DIAGNOSIS — Z130 Encounter for screening for diseases of the blood and blood-forming organs and certain disorders involving the immune mechanism: Secondary | ICD-10-CM | POA: Diagnosis not present

## 2023-04-12 DIAGNOSIS — R5383 Other fatigue: Secondary | ICD-10-CM | POA: Diagnosis not present

## 2023-04-21 DIAGNOSIS — M9904 Segmental and somatic dysfunction of sacral region: Secondary | ICD-10-CM | POA: Diagnosis not present

## 2023-04-21 DIAGNOSIS — M9903 Segmental and somatic dysfunction of lumbar region: Secondary | ICD-10-CM | POA: Diagnosis not present

## 2023-04-21 DIAGNOSIS — M9901 Segmental and somatic dysfunction of cervical region: Secondary | ICD-10-CM | POA: Diagnosis not present

## 2023-04-21 DIAGNOSIS — M9902 Segmental and somatic dysfunction of thoracic region: Secondary | ICD-10-CM | POA: Diagnosis not present

## 2023-05-04 DIAGNOSIS — F411 Generalized anxiety disorder: Secondary | ICD-10-CM | POA: Diagnosis not present

## 2023-05-04 DIAGNOSIS — F902 Attention-deficit hyperactivity disorder, combined type: Secondary | ICD-10-CM | POA: Diagnosis not present

## 2023-05-04 DIAGNOSIS — F339 Major depressive disorder, recurrent, unspecified: Secondary | ICD-10-CM | POA: Diagnosis not present

## 2023-05-05 DIAGNOSIS — M9903 Segmental and somatic dysfunction of lumbar region: Secondary | ICD-10-CM | POA: Diagnosis not present

## 2023-05-05 DIAGNOSIS — M9904 Segmental and somatic dysfunction of sacral region: Secondary | ICD-10-CM | POA: Diagnosis not present

## 2023-05-05 DIAGNOSIS — M9902 Segmental and somatic dysfunction of thoracic region: Secondary | ICD-10-CM | POA: Diagnosis not present

## 2023-05-05 DIAGNOSIS — M9901 Segmental and somatic dysfunction of cervical region: Secondary | ICD-10-CM | POA: Diagnosis not present

## 2023-05-11 DIAGNOSIS — M9902 Segmental and somatic dysfunction of thoracic region: Secondary | ICD-10-CM | POA: Diagnosis not present

## 2023-05-11 DIAGNOSIS — M9904 Segmental and somatic dysfunction of sacral region: Secondary | ICD-10-CM | POA: Diagnosis not present

## 2023-05-11 DIAGNOSIS — M9901 Segmental and somatic dysfunction of cervical region: Secondary | ICD-10-CM | POA: Diagnosis not present

## 2023-05-11 DIAGNOSIS — M9903 Segmental and somatic dysfunction of lumbar region: Secondary | ICD-10-CM | POA: Diagnosis not present

## 2023-05-18 DIAGNOSIS — M9903 Segmental and somatic dysfunction of lumbar region: Secondary | ICD-10-CM | POA: Diagnosis not present

## 2023-05-18 DIAGNOSIS — M9901 Segmental and somatic dysfunction of cervical region: Secondary | ICD-10-CM | POA: Diagnosis not present

## 2023-05-18 DIAGNOSIS — M9904 Segmental and somatic dysfunction of sacral region: Secondary | ICD-10-CM | POA: Diagnosis not present

## 2023-05-18 DIAGNOSIS — M9902 Segmental and somatic dysfunction of thoracic region: Secondary | ICD-10-CM | POA: Diagnosis not present

## 2023-06-01 DIAGNOSIS — M9902 Segmental and somatic dysfunction of thoracic region: Secondary | ICD-10-CM | POA: Diagnosis not present

## 2023-06-01 DIAGNOSIS — M9901 Segmental and somatic dysfunction of cervical region: Secondary | ICD-10-CM | POA: Diagnosis not present

## 2023-06-01 DIAGNOSIS — M9903 Segmental and somatic dysfunction of lumbar region: Secondary | ICD-10-CM | POA: Diagnosis not present

## 2023-06-01 DIAGNOSIS — M9904 Segmental and somatic dysfunction of sacral region: Secondary | ICD-10-CM | POA: Diagnosis not present

## 2023-06-08 DIAGNOSIS — M9901 Segmental and somatic dysfunction of cervical region: Secondary | ICD-10-CM | POA: Diagnosis not present

## 2023-06-08 DIAGNOSIS — M9904 Segmental and somatic dysfunction of sacral region: Secondary | ICD-10-CM | POA: Diagnosis not present

## 2023-06-08 DIAGNOSIS — M9902 Segmental and somatic dysfunction of thoracic region: Secondary | ICD-10-CM | POA: Diagnosis not present

## 2023-06-08 DIAGNOSIS — M9903 Segmental and somatic dysfunction of lumbar region: Secondary | ICD-10-CM | POA: Diagnosis not present

## 2023-06-15 DIAGNOSIS — M9902 Segmental and somatic dysfunction of thoracic region: Secondary | ICD-10-CM | POA: Diagnosis not present

## 2023-06-15 DIAGNOSIS — M9904 Segmental and somatic dysfunction of sacral region: Secondary | ICD-10-CM | POA: Diagnosis not present

## 2023-06-15 DIAGNOSIS — M9901 Segmental and somatic dysfunction of cervical region: Secondary | ICD-10-CM | POA: Diagnosis not present

## 2023-06-15 DIAGNOSIS — M9903 Segmental and somatic dysfunction of lumbar region: Secondary | ICD-10-CM | POA: Diagnosis not present

## 2023-06-22 DIAGNOSIS — M9901 Segmental and somatic dysfunction of cervical region: Secondary | ICD-10-CM | POA: Diagnosis not present

## 2023-06-22 DIAGNOSIS — M9904 Segmental and somatic dysfunction of sacral region: Secondary | ICD-10-CM | POA: Diagnosis not present

## 2023-06-22 DIAGNOSIS — M9903 Segmental and somatic dysfunction of lumbar region: Secondary | ICD-10-CM | POA: Diagnosis not present

## 2023-06-22 DIAGNOSIS — M9902 Segmental and somatic dysfunction of thoracic region: Secondary | ICD-10-CM | POA: Diagnosis not present

## 2023-06-29 DIAGNOSIS — M9904 Segmental and somatic dysfunction of sacral region: Secondary | ICD-10-CM | POA: Diagnosis not present

## 2023-06-29 DIAGNOSIS — M9901 Segmental and somatic dysfunction of cervical region: Secondary | ICD-10-CM | POA: Diagnosis not present

## 2023-06-29 DIAGNOSIS — M9902 Segmental and somatic dysfunction of thoracic region: Secondary | ICD-10-CM | POA: Diagnosis not present

## 2023-06-29 DIAGNOSIS — M9903 Segmental and somatic dysfunction of lumbar region: Secondary | ICD-10-CM | POA: Diagnosis not present

## 2023-07-06 DIAGNOSIS — H5711 Ocular pain, right eye: Secondary | ICD-10-CM | POA: Diagnosis not present

## 2023-07-09 DIAGNOSIS — F411 Generalized anxiety disorder: Secondary | ICD-10-CM | POA: Diagnosis not present

## 2023-07-09 DIAGNOSIS — F339 Major depressive disorder, recurrent, unspecified: Secondary | ICD-10-CM | POA: Diagnosis not present

## 2023-07-09 DIAGNOSIS — F902 Attention-deficit hyperactivity disorder, combined type: Secondary | ICD-10-CM | POA: Diagnosis not present

## 2023-07-11 DIAGNOSIS — H43813 Vitreous degeneration, bilateral: Secondary | ICD-10-CM | POA: Diagnosis not present

## 2023-07-13 DIAGNOSIS — M9903 Segmental and somatic dysfunction of lumbar region: Secondary | ICD-10-CM | POA: Diagnosis not present

## 2023-07-13 DIAGNOSIS — M9904 Segmental and somatic dysfunction of sacral region: Secondary | ICD-10-CM | POA: Diagnosis not present

## 2023-07-13 DIAGNOSIS — M9902 Segmental and somatic dysfunction of thoracic region: Secondary | ICD-10-CM | POA: Diagnosis not present

## 2023-07-13 DIAGNOSIS — M9901 Segmental and somatic dysfunction of cervical region: Secondary | ICD-10-CM | POA: Diagnosis not present

## 2023-07-28 DIAGNOSIS — M9903 Segmental and somatic dysfunction of lumbar region: Secondary | ICD-10-CM | POA: Diagnosis not present

## 2023-07-28 DIAGNOSIS — M9902 Segmental and somatic dysfunction of thoracic region: Secondary | ICD-10-CM | POA: Diagnosis not present

## 2023-07-28 DIAGNOSIS — M9904 Segmental and somatic dysfunction of sacral region: Secondary | ICD-10-CM | POA: Diagnosis not present

## 2023-07-28 DIAGNOSIS — M9901 Segmental and somatic dysfunction of cervical region: Secondary | ICD-10-CM | POA: Diagnosis not present

## 2023-08-01 DIAGNOSIS — F419 Anxiety disorder, unspecified: Secondary | ICD-10-CM | POA: Diagnosis not present

## 2023-08-01 DIAGNOSIS — F4312 Post-traumatic stress disorder, chronic: Secondary | ICD-10-CM | POA: Diagnosis not present

## 2023-08-01 DIAGNOSIS — F32A Depression, unspecified: Secondary | ICD-10-CM | POA: Diagnosis not present

## 2023-08-03 DIAGNOSIS — M9902 Segmental and somatic dysfunction of thoracic region: Secondary | ICD-10-CM | POA: Diagnosis not present

## 2023-08-03 DIAGNOSIS — M9903 Segmental and somatic dysfunction of lumbar region: Secondary | ICD-10-CM | POA: Diagnosis not present

## 2023-08-03 DIAGNOSIS — M9901 Segmental and somatic dysfunction of cervical region: Secondary | ICD-10-CM | POA: Diagnosis not present

## 2023-08-03 DIAGNOSIS — M9904 Segmental and somatic dysfunction of sacral region: Secondary | ICD-10-CM | POA: Diagnosis not present

## 2023-08-05 DIAGNOSIS — F4312 Post-traumatic stress disorder, chronic: Secondary | ICD-10-CM | POA: Diagnosis not present

## 2023-08-05 DIAGNOSIS — F329 Major depressive disorder, single episode, unspecified: Secondary | ICD-10-CM | POA: Diagnosis not present

## 2023-08-10 DIAGNOSIS — M9904 Segmental and somatic dysfunction of sacral region: Secondary | ICD-10-CM | POA: Diagnosis not present

## 2023-08-10 DIAGNOSIS — M9902 Segmental and somatic dysfunction of thoracic region: Secondary | ICD-10-CM | POA: Diagnosis not present

## 2023-08-10 DIAGNOSIS — M9903 Segmental and somatic dysfunction of lumbar region: Secondary | ICD-10-CM | POA: Diagnosis not present

## 2023-08-10 DIAGNOSIS — M9901 Segmental and somatic dysfunction of cervical region: Secondary | ICD-10-CM | POA: Diagnosis not present

## 2023-08-12 DIAGNOSIS — F32A Depression, unspecified: Secondary | ICD-10-CM | POA: Diagnosis not present

## 2023-08-12 DIAGNOSIS — F4312 Post-traumatic stress disorder, chronic: Secondary | ICD-10-CM | POA: Diagnosis not present

## 2023-08-12 DIAGNOSIS — F419 Anxiety disorder, unspecified: Secondary | ICD-10-CM | POA: Diagnosis not present

## 2023-08-22 DIAGNOSIS — M9901 Segmental and somatic dysfunction of cervical region: Secondary | ICD-10-CM | POA: Diagnosis not present

## 2023-08-22 DIAGNOSIS — M9903 Segmental and somatic dysfunction of lumbar region: Secondary | ICD-10-CM | POA: Diagnosis not present

## 2023-08-22 DIAGNOSIS — M9904 Segmental and somatic dysfunction of sacral region: Secondary | ICD-10-CM | POA: Diagnosis not present

## 2023-08-22 DIAGNOSIS — M9902 Segmental and somatic dysfunction of thoracic region: Secondary | ICD-10-CM | POA: Diagnosis not present

## 2023-08-24 DIAGNOSIS — F419 Anxiety disorder, unspecified: Secondary | ICD-10-CM | POA: Diagnosis not present

## 2023-08-24 DIAGNOSIS — F32A Depression, unspecified: Secondary | ICD-10-CM | POA: Diagnosis not present

## 2023-08-24 DIAGNOSIS — F4312 Post-traumatic stress disorder, chronic: Secondary | ICD-10-CM | POA: Diagnosis not present

## 2023-09-01 DIAGNOSIS — M9902 Segmental and somatic dysfunction of thoracic region: Secondary | ICD-10-CM | POA: Diagnosis not present

## 2023-09-01 DIAGNOSIS — M9904 Segmental and somatic dysfunction of sacral region: Secondary | ICD-10-CM | POA: Diagnosis not present

## 2023-09-01 DIAGNOSIS — M9903 Segmental and somatic dysfunction of lumbar region: Secondary | ICD-10-CM | POA: Diagnosis not present

## 2023-09-01 DIAGNOSIS — M9901 Segmental and somatic dysfunction of cervical region: Secondary | ICD-10-CM | POA: Diagnosis not present

## 2023-09-02 DIAGNOSIS — F32A Depression, unspecified: Secondary | ICD-10-CM | POA: Diagnosis not present

## 2023-09-02 DIAGNOSIS — F4312 Post-traumatic stress disorder, chronic: Secondary | ICD-10-CM | POA: Diagnosis not present

## 2023-09-02 DIAGNOSIS — F419 Anxiety disorder, unspecified: Secondary | ICD-10-CM | POA: Diagnosis not present

## 2023-09-07 DIAGNOSIS — M9904 Segmental and somatic dysfunction of sacral region: Secondary | ICD-10-CM | POA: Diagnosis not present

## 2023-09-07 DIAGNOSIS — M9903 Segmental and somatic dysfunction of lumbar region: Secondary | ICD-10-CM | POA: Diagnosis not present

## 2023-09-07 DIAGNOSIS — M9902 Segmental and somatic dysfunction of thoracic region: Secondary | ICD-10-CM | POA: Diagnosis not present

## 2023-09-07 DIAGNOSIS — M9901 Segmental and somatic dysfunction of cervical region: Secondary | ICD-10-CM | POA: Diagnosis not present

## 2023-09-12 ENCOUNTER — Ambulatory Visit (INDEPENDENT_AMBULATORY_CARE_PROVIDER_SITE_OTHER): Payer: 59 | Admitting: Nurse Practitioner

## 2023-09-12 ENCOUNTER — Encounter: Payer: Self-pay | Admitting: Nurse Practitioner

## 2023-09-12 VITALS — BP 126/51 | HR 76 | Temp 97.6°F | Ht 68.0 in | Wt 165.6 lb

## 2023-09-12 DIAGNOSIS — F32A Depression, unspecified: Secondary | ICD-10-CM | POA: Diagnosis not present

## 2023-09-12 DIAGNOSIS — E559 Vitamin D deficiency, unspecified: Secondary | ICD-10-CM

## 2023-09-12 DIAGNOSIS — E538 Deficiency of other specified B group vitamins: Secondary | ICD-10-CM

## 2023-09-12 DIAGNOSIS — Z13220 Encounter for screening for lipoid disorders: Secondary | ICD-10-CM | POA: Diagnosis not present

## 2023-09-12 DIAGNOSIS — Z0000 Encounter for general adult medical examination without abnormal findings: Secondary | ICD-10-CM

## 2023-09-12 DIAGNOSIS — F419 Anxiety disorder, unspecified: Secondary | ICD-10-CM

## 2023-09-12 DIAGNOSIS — Z1329 Encounter for screening for other suspected endocrine disorder: Secondary | ICD-10-CM | POA: Diagnosis not present

## 2023-09-12 NOTE — Patient Instructions (Addendum)
1. Anxiety and depression  - Ambulatory referral to Psychiatry  2. Lipid screening  - Lipid Panel  3. Thyroid disorder screen  - TSH  4. Vitamin B12 deficiency  - Vitamin B12  5. Vitamin D deficiency  - Vitamin D, 25-hydroxy  6. Routine adult health maintenance  - CBC - Comprehensive metabolic panel   Follow up:  Follow up in 3 months

## 2023-09-12 NOTE — Progress Notes (Signed)
Subjective   Patient ID: Shelby Ramirez, female    DOB: Jan 14, 1961, 62 y.o.   MRN: [id number]  Chief Complaint  Patient presents with   Establish Care    Referring provider: Lucky Cowboy, MD  Shelby Ramirez is a 62 y.o. 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  She would like to discuss getting a referral to psychiatry and have labs for complete physical.  She is currently seeing psychiatry through telehealth and is on medication.  She is still having significant anxiety and depression and feels that she may have adult ADHD.  We will place a referral to get her an appointment set up with psychiatry through [facility].  Patient did want labs checked because she has having extreme fatigue as well.  We discussed that this could be associated with depression but we will check for underlying causes through blood work today. Denies f/c/s, n/v/d, hemoptysis, PND, leg swelling Denies chest pain or edema        Allergies  Allergen Reactions   Flagyl [Metronidazole] Rash    Immunization History  Administered Date(s) Administered   Influenza Inj Mdck Quad With Preservative 08/31/2017   Moderna Sars-Covid-2 Vaccination 09/04/2020, 11/18/2020    Tobacco History: Social History   Tobacco Use  Smoking Status Never  Smokeless Tobacco Never   Counseling given: Not Answered   Outpatient Encounter Medications as of 09/12/2023  Medication Sig   b complex vitamins capsule Take 1 capsule by mouth daily.   BIOTIN PO Take by mouth.   buPROPion ER (WELLBUTRIN SR) 100 MG 12 hr tablet Take 100 mg by mouth daily.   CALCIUM PO    doxycycline (VIBRA-TABS) 100 MG tablet Take 1 tablet by mouth daily.   EC-RX Testosterone 0.4 % CREA Apply pea size amount to inner thigh 2 times weekly    estradiol (VIVELLE-DOT) 0.05 MG/24HR patch Place 1 patch onto the skin 2 (two) times a week.   progesterone (PROMETRIUM) 200 MG capsule Take 200 mg by mouth daily.   valACYclovir (VALTREX) 500 MG tablet Take 1 tablet (500 mg total) by mouth daily.   venlafaxine XR (EFFEXOR-XR) 75 MG 24 hr capsule Take 75 mg by mouth daily with breakfast.   VITAMIN D PO Take by mouth.   VITAMIN E PO Take by mouth.   clindamycin (CLEOCIN T) 1 % lotion    Multiple Vitamins-Minerals (MULTIVITAMIN ADULTS PO) Multivitamin 50 Plus   promethazine-dextromethorphan (PROMETHAZINE-DM) 6.25-15 MG/5ML syrup Take 5 mLs by mouth 4 (four) times daily as needed for cough. (Patient not taking: Reported on 09/12/2023)   No facility-administered encounter medications on file as of 09/12/2023.    Review of Systems  Review of Systems  Constitutional: Negative.   HENT: Negative.    Cardiovascular: Negative.   Gastrointestinal: Negative.   Allergic/Immunologic: Negative.   Neurological: Negative.   Psychiatric/Behavioral: Negative.       Objective:   BP (!) 126/51   Pulse 76   Temp 97.6 F (36.4 C)   Ht 5\' 8"  (1.727 m)   Wt 165 lb 9.6 oz (75.1 kg)   SpO2 99%   BMI 25.18 kg/m   Wt Readings from Last 5 Encounters:  09/12/23 165 lb 9.6 oz (75.1 kg)  08/12/22 177 lb 0.5 oz (80.3 kg)  10/30/18 162 lb  12.8 oz (73.8 kg)  09/06/18 173 lb 3.2 oz (78.6 kg)  08/31/17 164 lb 12.8 oz (74.8 kg)     Physical Exam Vitals and nursing note reviewed.  Constitutional:      General: She is not in acute distress.    Appearance: She is well-developed.  Cardiovascular:     Rate and Rhythm: Normal rate and regular rhythm.  Pulmonary:     Effort: Pulmonary effort is normal.     Breath sounds: Normal breath sounds.  Neurological:     Mental Status: She is alert and oriented to person, place, and time.       Assessment & Plan:   Anxiety and depression -     Ambulatory referral to Psychiatry  Lipid screening -     Lipid  panel  Thyroid disorder screen -     TSH  Vitamin B12 deficiency -     Vitamin B12  Vitamin D deficiency -     VITAMIN D 25 Hydroxy (Vit-D Deficiency, Fractures)  Routine adult health maintenance -     CBC -     Comprehensive metabolic panel     Return in about 3 months (around 12/12/2023).   Ivonne Andrew, NP 09/12/2023

## 2023-09-13 LAB — COMPREHENSIVE METABOLIC PANEL
ALT: 18 [IU]/L (ref 0–32)
AST: 18 [IU]/L (ref 0–40)
Albumin: 4 g/dL (ref 3.8–4.9)
Alkaline Phosphatase: 78 [IU]/L (ref 44–121)
BUN/Creatinine Ratio: 26 (ref 12–28)
BUN: 17 mg/dL (ref 8–27)
Bilirubin Total: <0.2 mg/dL (ref 0.0–1.2)
CO2: 23 mmol/L (ref 20–29)
Calcium: 9.2 mg/dL (ref 8.7–10.3)
Chloride: 108 mmol/L — ABNORMAL HIGH (ref 96–106)
Creatinine, Ser: 0.65 mg/dL (ref 0.57–1.00)
Globulin, Total: 2.5 g/dL (ref 1.5–4.5)
Glucose: 79 mg/dL (ref 70–99)
Potassium: 3.6 mmol/L (ref 3.5–5.2)
Sodium: 144 mmol/L (ref 134–144)
Total Protein: 6.5 g/dL (ref 6.0–8.5)
eGFR: 101 mL/min/{1.73_m2} (ref >59)

## 2023-09-13 LAB — LIPID PANEL
Chol/HDL Ratio: 3.3 {ratio} (ref 0.0–4.4)
Cholesterol, Total: 177 mg/dL (ref 100–199)
HDL: 53 mg/dL (ref >39)
LDL Chol Calc (NIH): 109 mg/dL — ABNORMAL HIGH (ref 0–99)
Triglycerides: 81 mg/dL (ref 0–149)
VLDL Cholesterol Cal: 15 mg/dL (ref 5–40)

## 2023-09-13 LAB — CBC
Hematocrit: 40.1 % (ref 34.0–46.6)
Hemoglobin: 12.9 g/dL (ref 11.1–15.9)
MCH: 31.5 pg (ref 26.6–33.0)
MCHC: 32.2 g/dL (ref 31.5–35.7)
MCV: 98 fL — ABNORMAL HIGH (ref 79–97)
Platelets: 311 10*3/uL (ref 150–450)
RBC: 4.1 x10E6/uL (ref 3.77–5.28)
RDW: 11.2 % — ABNORMAL LOW (ref 11.7–15.4)
WBC: 7.6 10*3/uL (ref 3.4–10.8)

## 2023-09-13 LAB — VITAMIN D 25 HYDROXY (VIT D DEFICIENCY, FRACTURES): Vit D, 25-Hydroxy: 13 ng/mL — ABNORMAL LOW (ref 30.0–100.0)

## 2023-09-13 LAB — TSH: TSH: 5.81 u[IU]/mL — ABNORMAL HIGH (ref 0.450–4.500)

## 2023-09-13 LAB — VITAMIN B12: Vitamin B-12: 548 pg/mL (ref 232–1245)
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
## 2023-09-14 ENCOUNTER — Other Ambulatory Visit: Payer: Self-pay | Admitting: Nurse Practitioner

## 2023-09-14 DIAGNOSIS — M9903 Segmental and somatic dysfunction of lumbar region: Secondary | ICD-10-CM | POA: Diagnosis not present

## 2023-09-14 DIAGNOSIS — M9902 Segmental and somatic dysfunction of thoracic region: Secondary | ICD-10-CM | POA: Diagnosis not present

## 2023-09-14 DIAGNOSIS — M9904 Segmental and somatic dysfunction of sacral region: Secondary | ICD-10-CM | POA: Diagnosis not present

## 2023-09-14 DIAGNOSIS — M9901 Segmental and somatic dysfunction of cervical region: Secondary | ICD-10-CM | POA: Diagnosis not present

## 2023-09-14 DIAGNOSIS — R7989 Other specified abnormal findings of blood chemistry: Secondary | ICD-10-CM

## 2023-09-14 DIAGNOSIS — E559 Vitamin D deficiency, unspecified: Secondary | ICD-10-CM

## 2023-09-14 MED ORDER — VITAMIN D (ERGOCALCIFEROL) 1.25 MG (50000 UNIT) PO CAPS
50000.0000 [IU] | ORAL_CAPSULE | ORAL | 2 refills | Status: DC
Start: 2023-09-14 — End: 2023-09-20

## 2023-09-15 DIAGNOSIS — F32A Depression, unspecified: Secondary | ICD-10-CM | POA: Diagnosis not present

## 2023-09-15 DIAGNOSIS — F419 Anxiety disorder, unspecified: Secondary | ICD-10-CM | POA: Diagnosis not present

## 2023-09-15 DIAGNOSIS — F4312 Post-traumatic stress disorder, chronic: Secondary | ICD-10-CM | POA: Diagnosis not present

## 2023-09-17 ENCOUNTER — Encounter (HOSPITAL_COMMUNITY): Payer: Self-pay | Admitting: Psychiatry

## 2023-09-17 ENCOUNTER — Ambulatory Visit (HOSPITAL_BASED_OUTPATIENT_CLINIC_OR_DEPARTMENT_OTHER): Payer: Self-pay | Admitting: Psychiatry

## 2023-09-17 DIAGNOSIS — F4310 Post-traumatic stress disorder, unspecified: Secondary | ICD-10-CM

## 2023-09-17 DIAGNOSIS — Z8659 Personal history of other mental and behavioral disorders: Secondary | ICD-10-CM | POA: Insufficient documentation

## 2023-09-17 DIAGNOSIS — E559 Vitamin D deficiency, unspecified: Secondary | ICD-10-CM

## 2023-09-17 DIAGNOSIS — F331 Major depressive disorder, recurrent, moderate: Secondary | ICD-10-CM

## 2023-09-17 DIAGNOSIS — F5025 Bulimia nervosa, in remission: Secondary | ICD-10-CM

## 2023-09-17 MED ORDER — BUPROPION HCL ER (SR) 100 MG PO TB12: 100.0000 mg | ORAL_TABLET | Freq: Every day | ORAL | 1 refills | Status: DC

## 2023-09-17 MED ORDER — DULOXETINE HCL 20 MG PO CPEP: 20.0000 mg | ORAL_CAPSULE | Freq: Every day | ORAL | 1 refills | Status: AC

## 2023-09-17 NOTE — Patient Instructions (Signed)
Thank you for attending your appointment today.  -- STOP Effexor at this time -- START Cymbalta 20 mg daily -- Continue other medications as prescribed.  Please do not make any changes to medications without first discussing with your provider. If you are experiencing a psychiatric emergency, please call 911 or present to your nearest emergency department. Additional crisis, medication management, and therapy resources are included below.  [facility]  [address], [place], [place] [address] [phone] WALK-IN URGENT CARE 24/7 FOR ANYONE [address] [place], [place]  [phone] Fax: [phone] [email or web address] *Interpreters available *Accepts all insurance and uninsured for Urgent Care needs *Accepts Medicaid and uninsured for outpatient treatment (below)      ONLY FOR [facility]  Below:    Outpatient New Patient Assessment/Therapy Walk-ins:        Monday -Thursday 8am until slots are full.        Every Friday 1pm-4pm  (first come, first served)                   New Patient Psychiatry/Medication Management        Monday-Friday 8am-11am (first come, first served)               For all walk-ins we ask that you arrive by 7:15am, because patients will be seen in the order of arrival.

## 2023-09-20 ENCOUNTER — Other Ambulatory Visit: Payer: Self-pay

## 2023-09-20 DIAGNOSIS — E559 Vitamin D deficiency, unspecified: Secondary | ICD-10-CM

## 2023-09-20 MED ORDER — VITAMIN D (ERGOCALCIFEROL) 1.25 MG (50000 UNIT) PO CAPS: 50000.0000 [IU] | ORAL_CAPSULE | ORAL | 2 refills | Status: DC

## 2023-09-21 DIAGNOSIS — M9904 Segmental and somatic dysfunction of sacral region: Secondary | ICD-10-CM | POA: Diagnosis not present

## 2023-09-21 DIAGNOSIS — M9903 Segmental and somatic dysfunction of lumbar region: Secondary | ICD-10-CM | POA: Diagnosis not present

## 2023-09-21 DIAGNOSIS — M9901 Segmental and somatic dysfunction of cervical region: Secondary | ICD-10-CM | POA: Diagnosis not present

## 2023-09-21 DIAGNOSIS — M9902 Segmental and somatic dysfunction of thoracic region: Secondary | ICD-10-CM | POA: Diagnosis not present

## 2023-09-22 DIAGNOSIS — F4312 Post-traumatic stress disorder, chronic: Secondary | ICD-10-CM | POA: Diagnosis not present

## 2023-09-22 DIAGNOSIS — F32A Depression, unspecified: Secondary | ICD-10-CM | POA: Diagnosis not present

## 2023-09-22 DIAGNOSIS — F419 Anxiety disorder, unspecified: Secondary | ICD-10-CM | POA: Diagnosis not present

## 2023-09-26 ENCOUNTER — Institutional Professional Consult (permissible substitution): Payer: Self-pay | Admitting: Clinical

## 2023-09-29 DIAGNOSIS — F419 Anxiety disorder, unspecified: Secondary | ICD-10-CM | POA: Diagnosis not present

## 2023-09-29 DIAGNOSIS — F4312 Post-traumatic stress disorder, chronic: Secondary | ICD-10-CM | POA: Diagnosis not present

## 2023-09-29 DIAGNOSIS — F32A Depression, unspecified: Secondary | ICD-10-CM | POA: Diagnosis not present

## 2023-10-06 DIAGNOSIS — F339 Major depressive disorder, recurrent, unspecified: Secondary | ICD-10-CM | POA: Diagnosis not present

## 2023-10-06 DIAGNOSIS — F411 Generalized anxiety disorder: Secondary | ICD-10-CM | POA: Diagnosis not present

## 2023-10-06 DIAGNOSIS — F902 Attention-deficit hyperactivity disorder, combined type: Secondary | ICD-10-CM | POA: Diagnosis not present

## 2023-10-08 DIAGNOSIS — F902 Attention-deficit hyperactivity disorder, combined type: Secondary | ICD-10-CM | POA: Diagnosis not present

## 2023-10-08 DIAGNOSIS — F339 Major depressive disorder, recurrent, unspecified: Secondary | ICD-10-CM | POA: Diagnosis not present

## 2023-10-08 DIAGNOSIS — F411 Generalized anxiety disorder: Secondary | ICD-10-CM | POA: Diagnosis not present

## 2023-10-10 DIAGNOSIS — M9903 Segmental and somatic dysfunction of lumbar region: Secondary | ICD-10-CM | POA: Diagnosis not present

## 2023-10-10 DIAGNOSIS — M9901 Segmental and somatic dysfunction of cervical region: Secondary | ICD-10-CM | POA: Diagnosis not present

## 2023-10-10 DIAGNOSIS — M9904 Segmental and somatic dysfunction of sacral region: Secondary | ICD-10-CM | POA: Diagnosis not present

## 2023-10-10 DIAGNOSIS — M9902 Segmental and somatic dysfunction of thoracic region: Secondary | ICD-10-CM | POA: Diagnosis not present

## 2023-10-17 ENCOUNTER — Other Ambulatory Visit: Payer: Self-pay | Admitting: Nurse Practitioner

## 2023-10-17 ENCOUNTER — Other Ambulatory Visit: Payer: 59

## 2023-10-17 DIAGNOSIS — R7989 Other specified abnormal findings of blood chemistry: Secondary | ICD-10-CM | POA: Diagnosis not present

## 2023-10-18 LAB — THYROID PANEL WITH TSH
Free Thyroxine Index: 1.7 (ref 1.2–4.9)
T3 Uptake Ratio: 25 % (ref 24–39)
T4, Total: 6.6 ug/dL (ref 4.5–12.0)
TSH: 3.32 u[IU]/mL (ref 0.450–4.500)

## 2023-10-19 ENCOUNTER — Ambulatory Visit (HOSPITAL_BASED_OUTPATIENT_CLINIC_OR_DEPARTMENT_OTHER): Payer: 59 | Admitting: Family

## 2023-10-19 ENCOUNTER — Encounter (HOSPITAL_COMMUNITY): Payer: Self-pay | Admitting: Family

## 2023-10-19 VITALS — BP 125/70 | HR 88 | Temp 98.3°F | Resp 18 | Wt 165.0 lb

## 2023-10-19 DIAGNOSIS — M9903 Segmental and somatic dysfunction of lumbar region: Secondary | ICD-10-CM | POA: Diagnosis not present

## 2023-10-19 DIAGNOSIS — F331 Major depressive disorder, recurrent, moderate: Secondary | ICD-10-CM

## 2023-10-19 DIAGNOSIS — M9901 Segmental and somatic dysfunction of cervical region: Secondary | ICD-10-CM | POA: Diagnosis not present

## 2023-10-19 DIAGNOSIS — M9904 Segmental and somatic dysfunction of sacral region: Secondary | ICD-10-CM | POA: Diagnosis not present

## 2023-10-19 DIAGNOSIS — M9902 Segmental and somatic dysfunction of thoracic region: Secondary | ICD-10-CM | POA: Diagnosis not present

## 2023-10-19 DIAGNOSIS — F4310 Post-traumatic stress disorder, unspecified: Secondary | ICD-10-CM | POA: Diagnosis not present

## 2023-10-19 MED ORDER — ATOMOXETINE HCL 10 MG PO CAPS
ORAL_CAPSULE | ORAL | 0 refills | Status: AC
Start: 2023-10-19 — End: 2023-11-22

## 2023-10-19 NOTE — Progress Notes (Signed)
BH MD/PA/NP OP Progress Note  10/19/2023 10:39 AM Shelby Ramirez  MRN:  [id number]  Chief Complaint:  Stated " I want to be restarted on my Adderall, I am not able to concentrate on simple tasks."   HPI:  [name] 62 year old Caucasian female presents for follow-up medication management appointment.  Reports a history related to depression,  anxiety,  posttraumatic stress disorder and attention deficit disorder. Denied that she had been evaluated by psychologist and/or had adult ADHD testing. she reports she was recently seen and evaluated [facility] where she was discontinued from Effexor and initiated on Cymbalta 20 mg.  Which she reports she is taking and tolerating well.  States she continues to have symptoms related to excessive tiredness, poor concentration and increased anxiety.  Reports she is currently prescribed Wellbutrin 100 mg daily which she reports she has been taking as directed.  She is denying suicidal or homicidal ideations.  Denies auditory or visual hallucinations.  Reports she is currently followed by therapy services however has not made any follow-up appointments at this time.  States she recently followed up with her primary care provider to address her low vitamin D level in addition to her thyroid panel-results pending.  Discussed options related to medication adjustments however education provided if symptoms are related to medical issues adjustments to current medication regimen may not be beneficial.  Patient reports she is in need of restarting Adderall as she states she is only taking it as needed and feels overall that her new medication regiment is somewhat helpful. "  Symptoms have improved slightly but I know that I am not 100%."  States unsure if her symptoms are related to menopause, past trauma or vitamin D assess thyroid levels.   Patient to continue Cymbalta 30 mg daily Continue Wellbutrin 100 mg daily Initiated Strattera 10 mg x 4 days  titrate to 20 mg daily Patient encouraged to follow-up with [place] attention specialist and/or [facility] for adult ADHD testing  Patient to follow-up 4 weeks for medication adherence/tolerability  During evaluation Jhania Achorn is sitting she is alert/oriented x 4; calm/cooperative; and mood congruent with affect.  Patient is speaking in a clear tone at moderate volume, and slightly pressured pace; with good eye contact. "  My mind is all over the place." Stated that thought process is coherent and relevant; has however is having a hard time concentrating here recently.  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 there is no indication that she is currently responding to internal/external stimuli or experiencing delusional thought content.  Patient denies suicidal/self-harm/homicidal ideation, psychosis, and paranoia.  Patient has remained calm and anxious throughout assessment and has answered questions appropriately.    Visit Diagnosis: No diagnosis found.  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   cryotherapy, never abnormal pap since that time   Depression    PTSD (post-traumatic stress disorder)     Past Surgical History:  Procedure Laterality Date   BREAST SURGERY  2000   Breast augmentation   FACIAL COSMETIC SURGERY     [place]   RADIOACTIVE SEED GUIDED EXCISIONAL BREAST BIOPSY Left 08/12/2022   Procedure: LEFT BREAST SEED GUIDED EXCISIONAL BIOPSY;  Surgeon: [name], MD;  Location: [facility];  Service: General;  Laterality: Left;  RHINOPLASTY     in [place]   tummy tuck     [place]    Family Psychiatric History:   Family History:  Family History  Problem Relation Age of Onset   Cancer Mother 41       breat   Cancer Father 25       lung and liver    Social History:   Social History   Socioeconomic History   Marital status: Divorced    Spouse name: Not on file   Number of children: Not on file   Years of education: Not on file   Highest education level: Not on file  Occupational History   Not on file  Tobacco Use   Smoking status: Never   Smokeless tobacco: Never  Vaping Use   Vaping status: Never Used  Substance and Sexual Activity   Alcohol use: Yes    Comment: rarely   Drug use: No    Comment: Reports use of CBD   Sexual activity: Not on file  Other Topics Concern   Not on file  Social History Narrative   Not on file   Social Determinants of Health   Financial Resource Strain: Not on file  Food Insecurity: Not on file  Transportation Needs: Not on file  Physical Activity: Not on file  Stress: Not on file  Social Connections: Not on file    Allergies:  Allergies  Allergen Reactions   Flagyl [Metronidazole] Rash    Metabolic Disorder Labs: No results found for: "HGBA1C", "MPG" No results found for: "PROLACTIN" Lab Results  Component Value Date   CHOL 177 09/12/2023   TRIG 81 09/12/2023   HDL 53 09/12/2023   CHOLHDL 3.3 09/12/2023   VLDL 14 08/30/2016   LDLCALC 109 (H) 09/12/2023   LDLCALC 100 (H) 09/01/2017   Lab Results  Component Value Date   TSH 3.320 10/17/2023   TSH 5.810 (H) 09/12/2023    Therapeutic Level Labs: No results found for: "LITHIUM" No results found for: "VALPROATE" No results found for: "CBMZ"  Current Medications: Current Outpatient Medications  Medication Sig Dispense Refill   b complex vitamins capsule Take 1 capsule by mouth daily.     BIOTIN PO Take by mouth.     buPROPion ER (WELLBUTRIN SR) 100 MG 12 hr tablet Take 1 tablet (100 mg total) by mouth daily. 30 tablet 1   CALCIUM PO  (Patient not taking: Reported on 09/17/2023)     clindamycin (CLEOCIN T) 1 % lotion  (Patient not taking: Reported on 09/17/2023)     doxycycline (VIBRA-TABS) 100 MG tablet Take 1 tablet by mouth daily.      DULoxetine (CYMBALTA) 20 MG capsule Take 1 capsule (20 mg total) by mouth daily. 30 capsule 1   EC-RX Testosterone 0.4 % CREA Apply pea size amount to inner thigh 2 times weekly     estradiol (VIVELLE-DOT) 0.05 MG/24HR patch Place 1 patch onto the skin 2 (two) times a week.     Multiple Vitamins-Minerals (MULTIVITAMIN ADULTS PO) Multivitamin 50 Plus     progesterone (PROMETRIUM) 200 MG capsule Take 200 mg by mouth daily.     valACYclovir (VALTREX) 500 MG tablet Take 1 tablet (500 mg total) by mouth daily. (Patient not taking: Reported on 09/17/2023) 30 tablet 5   Vitamin D, Ergocalciferol, (DRISDOL) 1.25 MG (50000 UNIT) CAPS capsule Take 1 capsule (50,000 Units total) by mouth every 7 (seven) days. 5 capsule 2   VITAMIN E PO Take by mouth.  No current facility-administered medications for this visit.     Musculoskeletal: Strength & Muscle Tone: within normal limits Gait & Station: normal Patient leans: N/A  Psychiatric Specialty Exam: Review of Systems  There were no vitals taken for this visit.There is no height or weight on file to calculate BMI.  General Appearance: Casual  Eye Contact:  Good  Speech:  Clear and Coherent  Volume:  Normal  Mood:  Anxious and Depressed  Affect:  Congruent  Thought Process:  Coherent  Orientation:  Full (Time, Place, and Person)  Thought Content: Logical   Suicidal Thoughts:  No  Homicidal Thoughts:  No  Memory:  Immediate;   Good Recent;   Good  Judgement:  Fair  Insight:  Good  Psychomotor Activity:  Normal  Concentration:  Concentration: Good  Recall:  Good  Fund of Knowledge: Good  Language: Fair  Akathisia:  No  Handed:  Right  AIMS (if indicated): done  Assets:  Communication Skills Desire for Improvement  ADL's:  Intact  Cognition: WNL  Sleep:  Good   Screenings: GAD-7    Flowsheet Row Office Visit from 09/12/2023 in [facility]  Total GAD-7 Score 18       PHQ2-9    Flowsheet Row [facility] Office Visit from 09/12/2023 in [facility]  PHQ-2 Total Score 6 3  PHQ-9 Total Score 17 16      Flowsheet Row Office Visit from 09/17/2023 in [facility] Admission (Discharged) from 08/12/2022 in [facility]  C-SSRS RISK CATEGORY No Risk No Risk        Assessment and Plan: Monee Dembeck 62 year old Caucasian female presents for medication management follow-up appointment.  She was initiated on Cymbalta 20 mg daily which she reports she has been taking and tolerating well.  Reports some symptom improvement however continues to endorse fatigue decreased concentration and mood irritability.  States she was recently started on a vitamin D monthly supplement.  States she is awaiting for thyroid panel testing results currently.  She is focused on restarting Adderall for concentration and focus.  Patient provided with additional outpatient resources for adult ADHD testing for [place] attention specialist and/or [facility] for medication management with Adderall -Patient offered Strattera 20 mg daily-appeared receptive to plan -Continue Cymbalta 20 mg daily Continue Wellbutrin 100 mg daily -Patient to keep outpatient follow-up appointment with her primary care provider to follow-up with thyroid disorder and low vitamin D levels  -Patient may benefit from intensive outpatient programming and/or partial hospitalization programming for additional coping skills.  Patient to follow-up 4 weeks  Collaboration of Care: Collaboration of Care: Medication Management AEB initiated Strattera 20 mg daily for reported ADHD symptoms.  Patient/Guardian was advised Release of Information must be obtained prior to any record release in order to collaborate their care with an outside provider. Patient/Guardian  was advised if they have not already done so to contact the registration department to sign all necessary forms in order for [place] to release information regarding their care.   Consent: Patient/Guardian gives verbal consent for treatment and assignment of benefits for services provided during this visit. Patient/Guardian expressed understanding and agreed to proceed.    Oneta Rack, NP 10/19/2023, 10:39 AM

## 2023-10-23 DIAGNOSIS — F339 Major depressive disorder, recurrent, unspecified: Secondary | ICD-10-CM | POA: Diagnosis not present

## 2023-10-23 DIAGNOSIS — F902 Attention-deficit hyperactivity disorder, combined type: Secondary | ICD-10-CM | POA: Diagnosis not present

## 2023-10-23 DIAGNOSIS — F411 Generalized anxiety disorder: Secondary | ICD-10-CM | POA: Diagnosis not present

## 2023-11-01 DIAGNOSIS — M9904 Segmental and somatic dysfunction of sacral region: Secondary | ICD-10-CM | POA: Diagnosis not present

## 2023-11-01 DIAGNOSIS — M9903 Segmental and somatic dysfunction of lumbar region: Secondary | ICD-10-CM | POA: Diagnosis not present

## 2023-11-01 DIAGNOSIS — M9901 Segmental and somatic dysfunction of cervical region: Secondary | ICD-10-CM | POA: Diagnosis not present

## 2023-11-01 DIAGNOSIS — M9902 Segmental and somatic dysfunction of thoracic region: Secondary | ICD-10-CM | POA: Diagnosis not present

## 2023-11-14 DIAGNOSIS — M9901 Segmental and somatic dysfunction of cervical region: Secondary | ICD-10-CM | POA: Diagnosis not present

## 2023-11-14 DIAGNOSIS — M9902 Segmental and somatic dysfunction of thoracic region: Secondary | ICD-10-CM | POA: Diagnosis not present

## 2023-11-14 DIAGNOSIS — M9903 Segmental and somatic dysfunction of lumbar region: Secondary | ICD-10-CM | POA: Diagnosis not present

## 2023-11-14 DIAGNOSIS — M9904 Segmental and somatic dysfunction of sacral region: Secondary | ICD-10-CM | POA: Diagnosis not present

## 2023-11-15 ENCOUNTER — Ambulatory Visit (HOSPITAL_COMMUNITY): Payer: 59 | Admitting: Family

## 2023-11-15 DIAGNOSIS — L821 Other seborrheic keratosis: Secondary | ICD-10-CM | POA: Diagnosis not present

## 2023-11-15 DIAGNOSIS — D225 Melanocytic nevi of trunk: Secondary | ICD-10-CM | POA: Diagnosis not present

## 2023-11-15 DIAGNOSIS — D1801 Hemangioma of skin and subcutaneous tissue: Secondary | ICD-10-CM | POA: Diagnosis not present

## 2023-11-15 DIAGNOSIS — D2271 Melanocytic nevi of right lower limb, including hip: Secondary | ICD-10-CM | POA: Diagnosis not present

## 2023-11-15 DIAGNOSIS — L700 Acne vulgaris: Secondary | ICD-10-CM | POA: Diagnosis not present

## 2023-11-15 DIAGNOSIS — L659 Nonscarring hair loss, unspecified: Secondary | ICD-10-CM | POA: Diagnosis not present

## 2023-11-15 DIAGNOSIS — D2272 Melanocytic nevi of left lower limb, including hip: Secondary | ICD-10-CM | POA: Diagnosis not present

## 2023-11-16 ENCOUNTER — Other Ambulatory Visit: Payer: Self-pay | Admitting: Nurse Practitioner

## 2023-11-16 DIAGNOSIS — E559 Vitamin D deficiency, unspecified: Secondary | ICD-10-CM

## 2023-11-18 ENCOUNTER — Other Ambulatory Visit (HOSPITAL_COMMUNITY): Payer: Self-pay | Admitting: Family

## 2023-11-29 DIAGNOSIS — F411 Generalized anxiety disorder: Secondary | ICD-10-CM | POA: Diagnosis not present

## 2023-11-29 DIAGNOSIS — F339 Major depressive disorder, recurrent, unspecified: Secondary | ICD-10-CM | POA: Diagnosis not present

## 2023-11-29 DIAGNOSIS — F902 Attention-deficit hyperactivity disorder, combined type: Secondary | ICD-10-CM | POA: Diagnosis not present

## 2023-12-15 ENCOUNTER — Other Ambulatory Visit: Payer: Self-pay | Admitting: Nurse Practitioner

## 2023-12-15 DIAGNOSIS — E559 Vitamin D deficiency, unspecified: Secondary | ICD-10-CM

## 2023-12-15 NOTE — Telephone Encounter (Signed)
 Please Advise Kh

## 2023-12-20 ENCOUNTER — Ambulatory Visit (HOSPITAL_COMMUNITY): Payer: 59 | Admitting: Family

## 2023-12-26 ENCOUNTER — Ambulatory Visit: Payer: Self-pay | Admitting: Nurse Practitioner

## 2023-12-27 ENCOUNTER — Ambulatory Visit (INDEPENDENT_AMBULATORY_CARE_PROVIDER_SITE_OTHER): Payer: 59 | Admitting: Nurse Practitioner

## 2023-12-27 ENCOUNTER — Encounter: Payer: Self-pay | Admitting: Nurse Practitioner

## 2023-12-27 VITALS — BP 121/61 | HR 83 | Temp 97.3°F | Wt 166.2 lb

## 2023-12-27 DIAGNOSIS — F32A Depression, unspecified: Secondary | ICD-10-CM

## 2023-12-27 DIAGNOSIS — Z0000 Encounter for general adult medical examination without abnormal findings: Secondary | ICD-10-CM | POA: Diagnosis not present

## 2023-12-27 DIAGNOSIS — Z8616 Personal history of COVID-19: Secondary | ICD-10-CM | POA: Diagnosis not present

## 2023-12-27 DIAGNOSIS — Z13220 Encounter for screening for lipoid disorders: Secondary | ICD-10-CM

## 2023-12-27 DIAGNOSIS — Z1329 Encounter for screening for other suspected endocrine disorder: Secondary | ICD-10-CM

## 2023-12-27 DIAGNOSIS — E559 Vitamin D deficiency, unspecified: Secondary | ICD-10-CM

## 2023-12-27 DIAGNOSIS — R5381 Other malaise: Secondary | ICD-10-CM | POA: Diagnosis not present

## 2023-12-27 DIAGNOSIS — F419 Anxiety disorder, unspecified: Secondary | ICD-10-CM

## 2023-12-27 NOTE — Progress Notes (Signed)
 Subjective   Patient ID: Shelby Ramirez, female    DOB: 1961-10-30, 63 y.o.   MRN: [id number]  Chief Complaint  Patient presents with   Follow-up    Referring provider: Oley Bascom RAMAN, NP  Markesha Lykins is a 63 y.o. female with Past Medical History: No date: Allergy No date: Anemia No date: Anxiety 12/13/1988: Cervical dysplasia     Comment:  cryotherapy, never abnormal pap since that time No date: Depression No date: PTSD (post-traumatic stress disorder)   HPI    Patient presents today for a follow up.  She would like to discuss getting a referral to psychiatry and have labs for complete physical.  She is currently seeing psychiatry through telehealth and is on medication.  She is still having significant anxiety and depression and feels that she may have adult ADHD.  We will place a referral to get her an appointment set up with psychiatry through [facility].  Patient has been feeling tired and fatigued.  We will place a referral for physical therapy for physical deconditioning as well.  Denies f/c/s, n/v/d, hemoptysis, PND, leg swelling. Denies chest pain or edema.     Allergies  Allergen Reactions   Flagyl [Metronidazole] Rash    Immunization History  Administered Date(s) Administered   Influenza Inj Mdck Quad With Preservative 08/31/2017   Moderna Sars-Covid-2 Vaccination 09/04/2020, 11/18/2020    Tobacco History: Social History   Tobacco Use  Smoking Status Never  Smokeless Tobacco Never   Counseling given: Not Answered   Outpatient Encounter Medications as of 12/27/2023  Medication Sig   b complex vitamins capsule Take 1 capsule by mouth daily.   BIOTIN PO Take by mouth.   buPROPion  ER (WELLBUTRIN  SR) 100 MG 12 hr tablet Take 1 tablet (100 mg total) by mouth daily.   CALCIUM  PO    clindamycin (CLEOCIN T) 1 % lotion    doxycycline (VIBRA-TABS) 100 MG tablet Take 1 tablet by mouth daily.   EC-RX Testosterone  0.4 % CREA Apply pea size amount to inner  thigh 2 times weekly   estradiol (VIVELLE-DOT) 0.05 MG/24HR patch Place 1 patch onto the skin 2 (two) times a week.   Multiple Vitamins-Minerals (MULTIVITAMIN ADULTS PO) Multivitamin 50 Plus   progesterone (PROMETRIUM) 200 MG capsule Take 200 mg by mouth daily.   Vitamin D , Ergocalciferol , (DRISDOL ) 1.25 MG (50000 UNIT) CAPS capsule TAKE 1 CAPSULE (50,000 UNITS TOTAL) BY MOUTH EVERY 7 (SEVEN) DAYS   VITAMIN E PO Take by mouth.   atomoxetine  (STRATTERA ) 10 MG capsule Take 1 capsule (10 mg total) by mouth daily for 4 days, THEN 2 capsules (20 mg total) daily. (Patient not taking: Reported on 12/27/2023)   DULoxetine  (CYMBALTA ) 20 MG capsule Take 1 capsule (20 mg total) by mouth daily. (Patient not taking: Reported on 12/27/2023)   valACYclovir  (VALTREX ) 500 MG tablet Take 1 tablet (500 mg total) by mouth daily. (Patient not taking: Reported on 12/27/2023)   No facility-administered encounter medications on file as of 12/27/2023.    Review of Systems  Review of Systems  Constitutional: Negative.   HENT: Negative.    Cardiovascular: Negative.   Gastrointestinal: Negative.   Allergic/Immunologic: Negative.   Neurological: Negative.   Psychiatric/Behavioral: Negative.       Objective:   BP 121/61   Pulse 83   Temp (!) 97.3 F (36.3 C)   Wt 166 lb 3.2 oz (75.4 kg)   SpO2 99%   BMI 25.27 kg/m   Wt Readings from Last  5 Encounters:  12/27/23 166 lb 3.2 oz (75.4 kg)  09/12/23 165 lb 9.6 oz (75.1 kg)  08/12/22 177 lb 0.5 oz (80.3 kg)  10/30/18 162 lb 12.8 oz (73.8 kg)  09/06/18 173 lb 3.2 oz (78.6 kg)     Physical Exam Vitals and nursing note reviewed.  Constitutional:      General: She is not in acute distress.    Appearance: She is well-developed.  Cardiovascular:     Rate and Rhythm: Normal rate and regular rhythm.  Pulmonary:     Effort: Pulmonary effort is normal.     Breath sounds: Normal breath sounds.  Neurological:     Mental Status: She is alert and oriented to  person, place, and time.       Assessment & Plan:   Anxiety and depression -     Ambulatory referral to Psychiatry -     AMB Referral [facility] Care Management  Vitamin D  deficiency -     VITAMIN D  25 Hydroxy (Vit-D Deficiency, Fractures); Future  Routine adult health maintenance -     CBC; Future -     Comprehensive metabolic panel; Future  Thyroid  disorder screen -     Thyroid  Panel With TSH; Future  Lipid screening -     Lipid panel; Future  History of COVID-19 -     Ambulatory referral to Physical Therapy  Physical deconditioning -     Ambulatory referral to Physical Therapy     Return in about 3 months (around 03/26/2024).     Bascom GORMAN Borer, NP 12/27/2023

## 2023-12-27 NOTE — Patient Instructions (Addendum)
 1. Anxiety and depression (Primary)  - Ambulatory referral to Psychiatry   2. Vitamin D deficiency  - Vitamin D, 25-hydroxy   3. Routine adult health maintenance  - CBC - Comprehensive metabolic panel    Follow up:  Follow up in 3 months

## 2023-12-28 ENCOUNTER — Telehealth: Payer: Self-pay | Admitting: *Deleted

## 2023-12-28 DIAGNOSIS — M9904 Segmental and somatic dysfunction of sacral region: Secondary | ICD-10-CM | POA: Diagnosis not present

## 2023-12-28 DIAGNOSIS — M9903 Segmental and somatic dysfunction of lumbar region: Secondary | ICD-10-CM | POA: Diagnosis not present

## 2023-12-28 DIAGNOSIS — M5136 Other intervertebral disc degeneration, lumbar region with discogenic back pain only: Secondary | ICD-10-CM | POA: Diagnosis not present

## 2023-12-28 DIAGNOSIS — M9905 Segmental and somatic dysfunction of pelvic region: Secondary | ICD-10-CM | POA: Diagnosis not present

## 2023-12-28 NOTE — Progress Notes (Signed)
Complex Care Management Note Care Guide Note  12/28/2023 Name: Shelby Ramirez MRN: [id number] DOB: [birth year]   Complex Care Management Outreach Attempts: An unsuccessful telephone outreach was attempted today to offer the patient information about available complex care management services.  Follow Up Plan:  Additional outreach attempts will be made to offer the patient complex care management information and services.   Encounter Outcome:  No Answer  Burman Nieves, CMA, Care Guide [facility]  [facility], [facility] Guide Direct Dial: [phone]  Fax: [phone] Website: [email or web address]

## 2023-12-29 ENCOUNTER — Other Ambulatory Visit: Payer: 59

## 2023-12-29 DIAGNOSIS — Z1329 Encounter for screening for other suspected endocrine disorder: Secondary | ICD-10-CM | POA: Diagnosis not present

## 2023-12-29 DIAGNOSIS — Z0000 Encounter for general adult medical examination without abnormal findings: Secondary | ICD-10-CM

## 2023-12-29 DIAGNOSIS — Z13220 Encounter for screening for lipoid disorders: Secondary | ICD-10-CM | POA: Diagnosis not present

## 2023-12-29 DIAGNOSIS — E559 Vitamin D deficiency, unspecified: Secondary | ICD-10-CM

## 2023-12-29 NOTE — Progress Notes (Signed)
Complex Care Management Note Care Guide Note  12/29/2023 Name: Shelby Ramirez MRN: [id number] DOB: [birth year]   Complex Care Management Outreach Attempts: A second unsuccessful outreach was attempted today to offer the patient with information about available complex care management services.  Follow Up Plan:  Additional outreach attempts will be made to offer the patient complex care management information and services.   Encounter Outcome:  No Answer  Burman Nieves, CMA, Care Guide [facility]  [facility], [facility] Guide Direct Dial: [phone]  Fax: [phone] Website: [email or web address]

## 2023-12-30 LAB — COMPREHENSIVE METABOLIC PANEL
ALT: 19 [IU]/L (ref 0–32)
AST: 19 [IU]/L (ref 0–40)
Albumin: 4.5 g/dL (ref 3.8–4.9)
Alkaline Phosphatase: 67 [IU]/L (ref 44–121)
BUN/Creatinine Ratio: 23 (ref 12–28)
BUN: 22 mg/dL (ref 8–27)
Bilirubin Total: 0.4 mg/dL (ref 0.0–1.2)
CO2: 26 mmol/L (ref 20–29)
Calcium: 9.8 mg/dL (ref 8.7–10.3)
Chloride: 104 mmol/L (ref 96–106)
Creatinine, Ser: 0.96 mg/dL (ref 0.57–1.00)
Globulin, Total: 2.1 g/dL (ref 1.5–4.5)
Glucose: 82 mg/dL (ref 70–99)
Potassium: 4.5 mmol/L (ref 3.5–5.2)
Sodium: 142 mmol/L (ref 134–144)
Total Protein: 6.6 g/dL (ref 6.0–8.5)
eGFR: 68 mL/min/{1.73_m2} (ref >59)

## 2023-12-30 LAB — THYROID PANEL WITH TSH
Free Thyroxine Index: 1.4 (ref 1.2–4.9)
T3 Uptake Ratio: 24 % (ref 24–39)
T4, Total: 5.9 ug/dL (ref 4.5–12.0)
TSH: 3.63 u[IU]/mL (ref 0.450–4.500)

## 2023-12-30 LAB — LIPID PANEL
Chol/HDL Ratio: 2.8 {ratio} (ref 0.0–4.4)
Cholesterol, Total: 208 mg/dL — ABNORMAL HIGH (ref 100–199)
HDL: 75 mg/dL (ref >39)
LDL Chol Calc (NIH): 121 mg/dL — ABNORMAL HIGH (ref 0–99)
Triglycerides: 66 mg/dL (ref 0–149)
VLDL Cholesterol Cal: 12 mg/dL (ref 5–40)

## 2023-12-30 LAB — CBC
Hematocrit: 41.1 % (ref 34.0–46.6)
Hemoglobin: 13.5 g/dL (ref 11.1–15.9)
MCH: 31 pg (ref 26.6–33.0)
MCHC: 32.8 g/dL (ref 31.5–35.7)
MCV: 94 fL (ref 79–97)
Platelets: 223 10*3/uL (ref 150–450)
RBC: 4.36 x10E6/uL (ref 3.77–5.28)
RDW: 12.5 % (ref 11.7–15.4)
WBC: 9.5 10*3/uL (ref 3.4–10.8)

## 2023-12-30 LAB — VITAMIN D 25 HYDROXY (VIT D DEFICIENCY, FRACTURES): Vit D, 25-Hydroxy: 48 ng/mL (ref 30.0–100.0)

## 2023-12-30 NOTE — Progress Notes (Signed)
Complex Care Management Note Care Guide Note  12/30/2023 Name: Rosine Langi MRN: [id number] DOB: [birth year]   Complex Care Management Outreach Attempts: A third unsuccessful outreach was attempted today to offer the patient with information about available complex care management services.  Follow Up Plan:  No further outreach attempts will be made at this time. We have been unable to contact the patient to offer or enroll patient in complex care management services.  Encounter Outcome:  Patient Scheduled  Burman Nieves, CMA, Care Guide [facility]  [facility], [facility] Guide Direct Dial: [phone]  Fax: [phone] Website: [email or web address]

## 2024-01-02 ENCOUNTER — Other Ambulatory Visit: Payer: Self-pay | Admitting: Nurse Practitioner

## 2024-01-02 MED ORDER — ROSUVASTATIN CALCIUM 5 MG PO TABS: 5.0000 mg | ORAL_TABLET | Freq: Every day | ORAL | 11 refills | Status: AC

## 2024-01-04 DIAGNOSIS — M5136 Other intervertebral disc degeneration, lumbar region with discogenic back pain only: Secondary | ICD-10-CM | POA: Diagnosis not present

## 2024-01-04 DIAGNOSIS — M9905 Segmental and somatic dysfunction of pelvic region: Secondary | ICD-10-CM | POA: Diagnosis not present

## 2024-01-04 DIAGNOSIS — M9903 Segmental and somatic dysfunction of lumbar region: Secondary | ICD-10-CM | POA: Diagnosis not present

## 2024-01-04 DIAGNOSIS — M9904 Segmental and somatic dysfunction of sacral region: Secondary | ICD-10-CM | POA: Diagnosis not present

## 2024-01-11 DIAGNOSIS — M9904 Segmental and somatic dysfunction of sacral region: Secondary | ICD-10-CM | POA: Diagnosis not present

## 2024-01-11 DIAGNOSIS — M5136 Other intervertebral disc degeneration, lumbar region with discogenic back pain only: Secondary | ICD-10-CM | POA: Diagnosis not present

## 2024-01-11 DIAGNOSIS — M9905 Segmental and somatic dysfunction of pelvic region: Secondary | ICD-10-CM | POA: Diagnosis not present

## 2024-01-11 DIAGNOSIS — M9903 Segmental and somatic dysfunction of lumbar region: Secondary | ICD-10-CM | POA: Diagnosis not present

## 2024-01-14 DIAGNOSIS — F339 Major depressive disorder, recurrent, unspecified: Secondary | ICD-10-CM | POA: Diagnosis not present

## 2024-01-14 DIAGNOSIS — F902 Attention-deficit hyperactivity disorder, combined type: Secondary | ICD-10-CM | POA: Diagnosis not present

## 2024-01-14 DIAGNOSIS — F411 Generalized anxiety disorder: Secondary | ICD-10-CM | POA: Diagnosis not present

## 2024-01-18 DIAGNOSIS — M9905 Segmental and somatic dysfunction of pelvic region: Secondary | ICD-10-CM | POA: Diagnosis not present

## 2024-01-18 DIAGNOSIS — M9904 Segmental and somatic dysfunction of sacral region: Secondary | ICD-10-CM | POA: Diagnosis not present

## 2024-01-18 DIAGNOSIS — M5136 Other intervertebral disc degeneration, lumbar region with discogenic back pain only: Secondary | ICD-10-CM | POA: Diagnosis not present

## 2024-01-18 DIAGNOSIS — M9903 Segmental and somatic dysfunction of lumbar region: Secondary | ICD-10-CM | POA: Diagnosis not present

## 2024-01-30 DIAGNOSIS — M9905 Segmental and somatic dysfunction of pelvic region: Secondary | ICD-10-CM | POA: Diagnosis not present

## 2024-01-30 DIAGNOSIS — M5136 Other intervertebral disc degeneration, lumbar region with discogenic back pain only: Secondary | ICD-10-CM | POA: Diagnosis not present

## 2024-01-30 DIAGNOSIS — M9903 Segmental and somatic dysfunction of lumbar region: Secondary | ICD-10-CM | POA: Diagnosis not present

## 2024-01-30 DIAGNOSIS — M9904 Segmental and somatic dysfunction of sacral region: Secondary | ICD-10-CM | POA: Diagnosis not present

## 2024-02-08 DIAGNOSIS — M9905 Segmental and somatic dysfunction of pelvic region: Secondary | ICD-10-CM | POA: Diagnosis not present

## 2024-02-08 DIAGNOSIS — M9903 Segmental and somatic dysfunction of lumbar region: Secondary | ICD-10-CM | POA: Diagnosis not present

## 2024-02-08 DIAGNOSIS — M9904 Segmental and somatic dysfunction of sacral region: Secondary | ICD-10-CM | POA: Diagnosis not present

## 2024-02-08 DIAGNOSIS — M5136 Other intervertebral disc degeneration, lumbar region with discogenic back pain only: Secondary | ICD-10-CM | POA: Diagnosis not present

## 2024-02-09 DIAGNOSIS — M62838 Other muscle spasm: Secondary | ICD-10-CM | POA: Diagnosis not present

## 2024-02-09 DIAGNOSIS — M9904 Segmental and somatic dysfunction of sacral region: Secondary | ICD-10-CM | POA: Diagnosis not present

## 2024-02-09 DIAGNOSIS — M9905 Segmental and somatic dysfunction of pelvic region: Secondary | ICD-10-CM | POA: Diagnosis not present

## 2024-02-09 DIAGNOSIS — M9903 Segmental and somatic dysfunction of lumbar region: Secondary | ICD-10-CM | POA: Diagnosis not present

## 2024-02-15 DIAGNOSIS — M5136 Other intervertebral disc degeneration, lumbar region with discogenic back pain only: Secondary | ICD-10-CM | POA: Diagnosis not present

## 2024-02-15 DIAGNOSIS — M9905 Segmental and somatic dysfunction of pelvic region: Secondary | ICD-10-CM | POA: Diagnosis not present

## 2024-02-15 DIAGNOSIS — M9903 Segmental and somatic dysfunction of lumbar region: Secondary | ICD-10-CM | POA: Diagnosis not present

## 2024-02-15 DIAGNOSIS — M9904 Segmental and somatic dysfunction of sacral region: Secondary | ICD-10-CM | POA: Diagnosis not present

## 2024-02-23 DIAGNOSIS — M9904 Segmental and somatic dysfunction of sacral region: Secondary | ICD-10-CM | POA: Diagnosis not present

## 2024-02-23 DIAGNOSIS — M9903 Segmental and somatic dysfunction of lumbar region: Secondary | ICD-10-CM | POA: Diagnosis not present

## 2024-02-23 DIAGNOSIS — M5136 Other intervertebral disc degeneration, lumbar region with discogenic back pain only: Secondary | ICD-10-CM | POA: Diagnosis not present

## 2024-02-23 DIAGNOSIS — M9905 Segmental and somatic dysfunction of pelvic region: Secondary | ICD-10-CM | POA: Diagnosis not present

## 2024-02-29 DIAGNOSIS — M5136 Other intervertebral disc degeneration, lumbar region with discogenic back pain only: Secondary | ICD-10-CM | POA: Diagnosis not present

## 2024-02-29 DIAGNOSIS — M9905 Segmental and somatic dysfunction of pelvic region: Secondary | ICD-10-CM | POA: Diagnosis not present

## 2024-02-29 DIAGNOSIS — M9903 Segmental and somatic dysfunction of lumbar region: Secondary | ICD-10-CM | POA: Diagnosis not present

## 2024-02-29 DIAGNOSIS — M9904 Segmental and somatic dysfunction of sacral region: Secondary | ICD-10-CM | POA: Diagnosis not present

## 2024-03-07 DIAGNOSIS — M9902 Segmental and somatic dysfunction of thoracic region: Secondary | ICD-10-CM | POA: Diagnosis not present

## 2024-03-07 DIAGNOSIS — M9901 Segmental and somatic dysfunction of cervical region: Secondary | ICD-10-CM | POA: Diagnosis not present

## 2024-03-07 DIAGNOSIS — M9903 Segmental and somatic dysfunction of lumbar region: Secondary | ICD-10-CM | POA: Diagnosis not present

## 2024-03-07 DIAGNOSIS — M9904 Segmental and somatic dysfunction of sacral region: Secondary | ICD-10-CM | POA: Diagnosis not present

## 2024-03-14 DIAGNOSIS — M9901 Segmental and somatic dysfunction of cervical region: Secondary | ICD-10-CM | POA: Diagnosis not present

## 2024-03-14 DIAGNOSIS — M9903 Segmental and somatic dysfunction of lumbar region: Secondary | ICD-10-CM | POA: Diagnosis not present

## 2024-03-14 DIAGNOSIS — M9902 Segmental and somatic dysfunction of thoracic region: Secondary | ICD-10-CM | POA: Diagnosis not present

## 2024-03-14 DIAGNOSIS — M9904 Segmental and somatic dysfunction of sacral region: Secondary | ICD-10-CM | POA: Diagnosis not present

## 2024-03-28 DIAGNOSIS — M9903 Segmental and somatic dysfunction of lumbar region: Secondary | ICD-10-CM | POA: Diagnosis not present

## 2024-03-28 DIAGNOSIS — M9902 Segmental and somatic dysfunction of thoracic region: Secondary | ICD-10-CM | POA: Diagnosis not present

## 2024-03-28 DIAGNOSIS — M9904 Segmental and somatic dysfunction of sacral region: Secondary | ICD-10-CM | POA: Diagnosis not present

## 2024-03-28 DIAGNOSIS — M9901 Segmental and somatic dysfunction of cervical region: Secondary | ICD-10-CM | POA: Diagnosis not present

## 2024-04-11 DIAGNOSIS — M9901 Segmental and somatic dysfunction of cervical region: Secondary | ICD-10-CM | POA: Diagnosis not present

## 2024-04-11 DIAGNOSIS — M9904 Segmental and somatic dysfunction of sacral region: Secondary | ICD-10-CM | POA: Diagnosis not present

## 2024-04-11 DIAGNOSIS — M9902 Segmental and somatic dysfunction of thoracic region: Secondary | ICD-10-CM | POA: Diagnosis not present

## 2024-04-11 DIAGNOSIS — M9903 Segmental and somatic dysfunction of lumbar region: Secondary | ICD-10-CM | POA: Diagnosis not present

## 2024-04-24 DIAGNOSIS — M9903 Segmental and somatic dysfunction of lumbar region: Secondary | ICD-10-CM | POA: Diagnosis not present

## 2024-04-24 DIAGNOSIS — M9901 Segmental and somatic dysfunction of cervical region: Secondary | ICD-10-CM | POA: Diagnosis not present

## 2024-04-24 DIAGNOSIS — M9902 Segmental and somatic dysfunction of thoracic region: Secondary | ICD-10-CM | POA: Diagnosis not present

## 2024-04-24 DIAGNOSIS — M9904 Segmental and somatic dysfunction of sacral region: Secondary | ICD-10-CM | POA: Diagnosis not present

## 2024-05-01 DIAGNOSIS — M9902 Segmental and somatic dysfunction of thoracic region: Secondary | ICD-10-CM | POA: Diagnosis not present

## 2024-05-01 DIAGNOSIS — M9903 Segmental and somatic dysfunction of lumbar region: Secondary | ICD-10-CM | POA: Diagnosis not present

## 2024-05-01 DIAGNOSIS — M9901 Segmental and somatic dysfunction of cervical region: Secondary | ICD-10-CM | POA: Diagnosis not present

## 2024-05-01 DIAGNOSIS — M9904 Segmental and somatic dysfunction of sacral region: Secondary | ICD-10-CM | POA: Diagnosis not present

## 2024-05-09 DIAGNOSIS — M9904 Segmental and somatic dysfunction of sacral region: Secondary | ICD-10-CM | POA: Diagnosis not present

## 2024-05-09 DIAGNOSIS — M9903 Segmental and somatic dysfunction of lumbar region: Secondary | ICD-10-CM | POA: Diagnosis not present

## 2024-05-09 DIAGNOSIS — M9905 Segmental and somatic dysfunction of pelvic region: Secondary | ICD-10-CM | POA: Diagnosis not present

## 2024-05-09 DIAGNOSIS — Z1389 Encounter for screening for other disorder: Secondary | ICD-10-CM | POA: Diagnosis not present

## 2024-05-09 DIAGNOSIS — Z130 Encounter for screening for diseases of the blood and blood-forming organs and certain disorders involving the immune mechanism: Secondary | ICD-10-CM | POA: Diagnosis not present

## 2024-05-09 DIAGNOSIS — Z780 Asymptomatic menopausal state: Secondary | ICD-10-CM | POA: Diagnosis not present

## 2024-05-09 DIAGNOSIS — Z01419 Encounter for gynecological examination (general) (routine) without abnormal findings: Secondary | ICD-10-CM | POA: Diagnosis not present

## 2024-05-09 DIAGNOSIS — M9902 Segmental and somatic dysfunction of thoracic region: Secondary | ICD-10-CM | POA: Diagnosis not present

## 2024-05-09 DIAGNOSIS — Z79890 Hormone replacement therapy (postmenopausal): Secondary | ICD-10-CM | POA: Diagnosis not present

## 2024-05-09 DIAGNOSIS — Z1231 Encounter for screening mammogram for malignant neoplasm of breast: Secondary | ICD-10-CM | POA: Diagnosis not present

## 2024-05-16 DIAGNOSIS — M9904 Segmental and somatic dysfunction of sacral region: Secondary | ICD-10-CM | POA: Diagnosis not present

## 2024-05-16 DIAGNOSIS — M9903 Segmental and somatic dysfunction of lumbar region: Secondary | ICD-10-CM | POA: Diagnosis not present

## 2024-05-16 DIAGNOSIS — M9902 Segmental and somatic dysfunction of thoracic region: Secondary | ICD-10-CM | POA: Diagnosis not present

## 2024-05-16 DIAGNOSIS — M9905 Segmental and somatic dysfunction of pelvic region: Secondary | ICD-10-CM | POA: Diagnosis not present

## 2024-05-23 DIAGNOSIS — M9903 Segmental and somatic dysfunction of lumbar region: Secondary | ICD-10-CM | POA: Diagnosis not present

## 2024-05-23 DIAGNOSIS — M9905 Segmental and somatic dysfunction of pelvic region: Secondary | ICD-10-CM | POA: Diagnosis not present

## 2024-05-23 DIAGNOSIS — M9902 Segmental and somatic dysfunction of thoracic region: Secondary | ICD-10-CM | POA: Diagnosis not present

## 2024-05-23 DIAGNOSIS — M9904 Segmental and somatic dysfunction of sacral region: Secondary | ICD-10-CM | POA: Diagnosis not present

## 2024-05-30 DIAGNOSIS — M9904 Segmental and somatic dysfunction of sacral region: Secondary | ICD-10-CM | POA: Diagnosis not present

## 2024-05-30 DIAGNOSIS — M9905 Segmental and somatic dysfunction of pelvic region: Secondary | ICD-10-CM | POA: Diagnosis not present

## 2024-05-30 DIAGNOSIS — M9902 Segmental and somatic dysfunction of thoracic region: Secondary | ICD-10-CM | POA: Diagnosis not present

## 2024-05-30 DIAGNOSIS — M9903 Segmental and somatic dysfunction of lumbar region: Secondary | ICD-10-CM | POA: Diagnosis not present

## 2024-06-06 DIAGNOSIS — M9902 Segmental and somatic dysfunction of thoracic region: Secondary | ICD-10-CM | POA: Diagnosis not present

## 2024-06-06 DIAGNOSIS — M9905 Segmental and somatic dysfunction of pelvic region: Secondary | ICD-10-CM | POA: Diagnosis not present

## 2024-06-06 DIAGNOSIS — M9903 Segmental and somatic dysfunction of lumbar region: Secondary | ICD-10-CM | POA: Diagnosis not present

## 2024-06-06 DIAGNOSIS — M9904 Segmental and somatic dysfunction of sacral region: Secondary | ICD-10-CM | POA: Diagnosis not present

## 2024-06-13 DIAGNOSIS — M9905 Segmental and somatic dysfunction of pelvic region: Secondary | ICD-10-CM | POA: Diagnosis not present

## 2024-06-13 DIAGNOSIS — M9903 Segmental and somatic dysfunction of lumbar region: Secondary | ICD-10-CM | POA: Diagnosis not present

## 2024-06-13 DIAGNOSIS — M9904 Segmental and somatic dysfunction of sacral region: Secondary | ICD-10-CM | POA: Diagnosis not present

## 2024-06-13 DIAGNOSIS — M9902 Segmental and somatic dysfunction of thoracic region: Secondary | ICD-10-CM | POA: Diagnosis not present

## 2024-06-18 DIAGNOSIS — L650 Telogen effluvium: Secondary | ICD-10-CM | POA: Diagnosis not present

## 2024-06-18 DIAGNOSIS — L649 Androgenic alopecia, unspecified: Secondary | ICD-10-CM | POA: Diagnosis not present

## 2024-06-20 DIAGNOSIS — M9905 Segmental and somatic dysfunction of pelvic region: Secondary | ICD-10-CM | POA: Diagnosis not present

## 2024-06-20 DIAGNOSIS — M9902 Segmental and somatic dysfunction of thoracic region: Secondary | ICD-10-CM | POA: Diagnosis not present

## 2024-06-20 DIAGNOSIS — M9903 Segmental and somatic dysfunction of lumbar region: Secondary | ICD-10-CM | POA: Diagnosis not present

## 2024-06-20 DIAGNOSIS — M9904 Segmental and somatic dysfunction of sacral region: Secondary | ICD-10-CM | POA: Diagnosis not present

## 2024-06-25 ENCOUNTER — Encounter: Payer: Self-pay | Admitting: Nurse Practitioner

## 2024-06-25 ENCOUNTER — Ambulatory Visit (INDEPENDENT_AMBULATORY_CARE_PROVIDER_SITE_OTHER): Payer: Self-pay | Admitting: Nurse Practitioner

## 2024-06-25 VITALS — BP 116/71 | HR 65 | Ht 68.0 in | Wt 170.0 lb

## 2024-06-25 DIAGNOSIS — E559 Vitamin D deficiency, unspecified: Secondary | ICD-10-CM | POA: Diagnosis not present

## 2024-06-25 DIAGNOSIS — F419 Anxiety disorder, unspecified: Secondary | ICD-10-CM

## 2024-06-25 DIAGNOSIS — Z13220 Encounter for screening for lipoid disorders: Secondary | ICD-10-CM | POA: Diagnosis not present

## 2024-06-25 DIAGNOSIS — F32A Depression, unspecified: Secondary | ICD-10-CM

## 2024-06-25 MED ORDER — VITAMIN D (ERGOCALCIFEROL) 1.25 MG (50000 UNIT) PO CAPS: 50000.0000 [IU] | ORAL_CAPSULE | ORAL | 2 refills | Status: AC

## 2024-06-25 NOTE — Progress Notes (Signed)
 Subjective   Patient ID: Shelby Ramirez, female    DOB: 1960/12/14, 63 y.o.   MRN: [id number]  Chief Complaint  Patient presents with   Depression    Referring provider: Oley Bascom RAMAN, NP  Shelby Ramirez is a 63 y.o. female with Past Medical History: No date: Allergy No date: Anemia No date: Anxiety 12/13/1988: Cervical dysplasia     Comment:  cryotherapy, never abnormal pap since that time No date: Depression No date: PTSD (post-traumatic stress disorder)   HPI  Patient presents today for follow-up visit.  She states that she is doing much better with anxiety and depression at this time.  She has not taken any of her depression medications.  She did not start taking cholesterol medications after last visit.  She would like her cholesterol rechecked today.  She is fasting today.  She would like a referral back to psychiatry for evaluation for adult ADHD.  Will place referral today. Denies f/c/s, n/v/d, hemoptysis, PND, leg swelling Denies chest pain or edema    Allergies  Allergen Reactions   Flagyl [Metronidazole] Rash    Immunization History  Administered Date(s) Administered   Influenza Inj Mdck Quad With Preservative 08/31/2017   Moderna Sars-Covid-2 Vaccination 09/04/2020, 11/18/2020    Tobacco History: Social History   Tobacco Use  Smoking Status Never  Smokeless Tobacco Never   Counseling given: Not Answered   Outpatient Encounter Medications as of 06/25/2024  Medication Sig   b complex vitamins capsule Take 1 capsule by mouth daily.   BIOTIN PO Take by mouth.   buPROPion  (WELLBUTRIN  SR) 200 MG 12 hr tablet Take 200 mg by mouth daily.   CALCIUM  PO    clindamycin (CLEOCIN T) 1 % lotion    doxycycline (VIBRA-TABS) 100 MG tablet Take 1 tablet by mouth daily.   EC-RX Testosterone  0.4 % CREA Apply pea size amount to inner thigh 2 times weekly   estradiol (VIVELLE-DOT) 0.05 MG/24HR patch Place 1 patch onto the skin 2 (two) times a week.   Multiple  Vitamins-Minerals (MULTIVITAMIN ADULTS PO) Multivitamin 50 Plus   progesterone (PROMETRIUM) 200 MG capsule Take 200 mg by mouth daily.   progesterone (PROMETRIUM) 200 MG capsule Take 200 mg by mouth at bedtime.   tretinoin (RETIN-A) 0.025 % cream Apply topically at bedtime.   VITAMIN E PO Take by mouth.   [DISCONTINUED] Vitamin D , Ergocalciferol , (DRISDOL ) 1.25 MG (50000 UNIT) CAPS capsule TAKE 1 CAPSULE (50,000 UNITS TOTAL) BY MOUTH EVERY 7 (SEVEN) DAYS   atomoxetine  (STRATTERA ) 10 MG capsule Take 1 capsule (10 mg total) by mouth daily for 4 days, THEN 2 capsules (20 mg total) daily. (Patient not taking: Reported on 12/27/2023)   DULoxetine  (CYMBALTA ) 20 MG capsule Take 1 capsule (20 mg total) by mouth daily. (Patient not taking: Reported on 12/27/2023)   rosuvastatin  (CRESTOR ) 5 MG tablet Take 1 tablet (5 mg total) by mouth daily. (Patient not taking: Reported on 06/25/2024)   valACYclovir  (VALTREX ) 500 MG tablet Take 1 tablet (500 mg total) by mouth daily. (Patient not taking: Reported on 06/25/2024)   venlafaxine XR (EFFEXOR-XR) 37.5 MG 24 hr capsule Take 1 tablet by mouth daily. (Patient not taking: Reported on 06/25/2024)   Vitamin D , Ergocalciferol , (DRISDOL ) 1.25 MG (50000 UNIT) CAPS capsule Take 1 capsule (50,000 Units total) by mouth every 7 (seven) days.   No facility-administered encounter medications on file as of 06/25/2024.    Review of Systems  Review of Systems  Constitutional: Negative.   HENT: Negative.  Cardiovascular: Negative.   Gastrointestinal: Negative.   Allergic/Immunologic: Negative.   Neurological: Negative.      Objective:   BP 116/71 (BP Location: Right Arm, Patient Position: Sitting, Cuff Size: Normal)   Pulse 65   Ht 5' 8 (1.727 m)   Wt 170 lb (77.1 kg)   SpO2 98%   BMI 25.85 kg/m   Wt Readings from Last 5 Encounters:  06/25/24 170 lb (77.1 kg)  12/27/23 166 lb 3.2 oz (75.4 kg)  09/12/23 165 lb 9.6 oz (75.1 kg)  08/12/22 177 lb 0.5 oz (80.3 kg)   10/30/18 162 lb 12.8 oz (73.8 kg)     Physical Exam Vitals and nursing note reviewed.  Constitutional:      General: She is not in acute distress.    Appearance: She is well-developed.  Cardiovascular:     Rate and Rhythm: Normal rate and regular rhythm.  Pulmonary:     Effort: Pulmonary effort is normal.     Breath sounds: Normal breath sounds.  Neurological:     Mental Status: She is alert and oriented to person, place, and time.       Assessment & Plan:   Anxiety and depression -     Ambulatory referral to Psychiatry  Vitamin D  deficiency -     Vitamin D  (Ergocalciferol ); Take 1 capsule (50,000 Units total) by mouth every 7 (seven) days.  Dispense: 12 capsule; Refill: 2  Lipid screening -     Lipid panel     Return in about 6 months (around 12/26/2024).   Bascom GORMAN Borer, NP 06/25/2024

## 2024-06-26 LAB — LIPID PANEL
Chol/HDL Ratio: 3.1 ratio (ref 0.0–4.4)
Cholesterol, Total: 199 mg/dL (ref 100–199)
HDL: 64 mg/dL (ref >39)
LDL Chol Calc (NIH): 118 mg/dL — ABNORMAL HIGH (ref 0–99)
Triglycerides: 93 mg/dL (ref 0–149)
VLDL Cholesterol Cal: 17 mg/dL (ref 5–40)

## 2024-06-27 ENCOUNTER — Ambulatory Visit: Payer: Self-pay | Admitting: Nurse Practitioner

## 2024-06-27 DIAGNOSIS — M9904 Segmental and somatic dysfunction of sacral region: Secondary | ICD-10-CM | POA: Diagnosis not present

## 2024-06-27 DIAGNOSIS — M9903 Segmental and somatic dysfunction of lumbar region: Secondary | ICD-10-CM | POA: Diagnosis not present

## 2024-06-27 DIAGNOSIS — M9905 Segmental and somatic dysfunction of pelvic region: Secondary | ICD-10-CM | POA: Diagnosis not present

## 2024-06-27 DIAGNOSIS — M9902 Segmental and somatic dysfunction of thoracic region: Secondary | ICD-10-CM | POA: Diagnosis not present

## 2024-07-04 DIAGNOSIS — M9903 Segmental and somatic dysfunction of lumbar region: Secondary | ICD-10-CM | POA: Diagnosis not present

## 2024-07-04 DIAGNOSIS — M9902 Segmental and somatic dysfunction of thoracic region: Secondary | ICD-10-CM | POA: Diagnosis not present

## 2024-07-04 DIAGNOSIS — M9905 Segmental and somatic dysfunction of pelvic region: Secondary | ICD-10-CM | POA: Diagnosis not present

## 2024-07-04 DIAGNOSIS — M9904 Segmental and somatic dysfunction of sacral region: Secondary | ICD-10-CM | POA: Diagnosis not present

## 2024-07-12 DIAGNOSIS — M9903 Segmental and somatic dysfunction of lumbar region: Secondary | ICD-10-CM | POA: Diagnosis not present

## 2024-07-12 DIAGNOSIS — M9904 Segmental and somatic dysfunction of sacral region: Secondary | ICD-10-CM | POA: Diagnosis not present

## 2024-07-12 DIAGNOSIS — M62838 Other muscle spasm: Secondary | ICD-10-CM | POA: Diagnosis not present

## 2024-07-12 DIAGNOSIS — M9905 Segmental and somatic dysfunction of pelvic region: Secondary | ICD-10-CM | POA: Diagnosis not present

## 2024-07-18 DIAGNOSIS — M9903 Segmental and somatic dysfunction of lumbar region: Secondary | ICD-10-CM | POA: Diagnosis not present

## 2024-07-18 DIAGNOSIS — M62838 Other muscle spasm: Secondary | ICD-10-CM | POA: Diagnosis not present

## 2024-07-18 DIAGNOSIS — M9904 Segmental and somatic dysfunction of sacral region: Secondary | ICD-10-CM | POA: Diagnosis not present

## 2024-07-18 DIAGNOSIS — M9905 Segmental and somatic dysfunction of pelvic region: Secondary | ICD-10-CM | POA: Diagnosis not present

## 2024-07-25 DIAGNOSIS — M9904 Segmental and somatic dysfunction of sacral region: Secondary | ICD-10-CM | POA: Diagnosis not present

## 2024-07-25 DIAGNOSIS — M62838 Other muscle spasm: Secondary | ICD-10-CM | POA: Diagnosis not present

## 2024-07-25 DIAGNOSIS — M9903 Segmental and somatic dysfunction of lumbar region: Secondary | ICD-10-CM | POA: Diagnosis not present

## 2024-07-25 DIAGNOSIS — M9905 Segmental and somatic dysfunction of pelvic region: Secondary | ICD-10-CM | POA: Diagnosis not present

## 2024-08-01 DIAGNOSIS — M9905 Segmental and somatic dysfunction of pelvic region: Secondary | ICD-10-CM | POA: Diagnosis not present

## 2024-08-01 DIAGNOSIS — M62838 Other muscle spasm: Secondary | ICD-10-CM | POA: Diagnosis not present

## 2024-08-01 DIAGNOSIS — M9903 Segmental and somatic dysfunction of lumbar region: Secondary | ICD-10-CM | POA: Diagnosis not present

## 2024-08-01 DIAGNOSIS — M9904 Segmental and somatic dysfunction of sacral region: Secondary | ICD-10-CM | POA: Diagnosis not present

## 2024-08-09 DIAGNOSIS — M9903 Segmental and somatic dysfunction of lumbar region: Secondary | ICD-10-CM | POA: Diagnosis not present

## 2024-08-09 DIAGNOSIS — M9904 Segmental and somatic dysfunction of sacral region: Secondary | ICD-10-CM | POA: Diagnosis not present

## 2024-08-09 DIAGNOSIS — M62838 Other muscle spasm: Secondary | ICD-10-CM | POA: Diagnosis not present

## 2024-08-09 DIAGNOSIS — M9905 Segmental and somatic dysfunction of pelvic region: Secondary | ICD-10-CM | POA: Diagnosis not present

## 2024-08-22 DIAGNOSIS — M9903 Segmental and somatic dysfunction of lumbar region: Secondary | ICD-10-CM | POA: Diagnosis not present

## 2024-08-22 DIAGNOSIS — M62838 Other muscle spasm: Secondary | ICD-10-CM | POA: Diagnosis not present

## 2024-08-22 DIAGNOSIS — M9905 Segmental and somatic dysfunction of pelvic region: Secondary | ICD-10-CM | POA: Diagnosis not present

## 2024-08-22 DIAGNOSIS — M9904 Segmental and somatic dysfunction of sacral region: Secondary | ICD-10-CM | POA: Diagnosis not present

## 2024-12-26 ENCOUNTER — Ambulatory Visit: Payer: Self-pay | Admitting: Nurse Practitioner

## 2024-12-27 ENCOUNTER — Encounter: Payer: Self-pay | Admitting: Nurse Practitioner
